# Patient Record
Sex: Female | Born: 1985 | Race: Black or African American | Hispanic: No | Marital: Married | Smoking: Never smoker
Health system: Southern US, Community
[De-identification: ages and names within clinical notes are randomized; demographics above are authoritative.]

## PROBLEM LIST (undated history)

## (undated) ENCOUNTER — Inpatient Hospital Stay (HOSPITAL_COMMUNITY): Payer: Self-pay

## (undated) ENCOUNTER — Emergency Department (HOSPITAL_COMMUNITY): Admission: EM | Payer: Self-pay | Source: Home / Self Care

## (undated) DIAGNOSIS — Z5189 Encounter for other specified aftercare: Secondary | ICD-10-CM

## (undated) DIAGNOSIS — O24419 Gestational diabetes mellitus in pregnancy, unspecified control: Secondary | ICD-10-CM

## (undated) DIAGNOSIS — Z3483 Encounter for supervision of other normal pregnancy, third trimester: Secondary | ICD-10-CM

## (undated) DIAGNOSIS — N898 Other specified noninflammatory disorders of vagina: Secondary | ICD-10-CM

## (undated) DIAGNOSIS — Z789 Other specified health status: Secondary | ICD-10-CM

## (undated) DIAGNOSIS — O09299 Supervision of pregnancy with other poor reproductive or obstetric history, unspecified trimester: Secondary | ICD-10-CM

## (undated) HISTORY — DX: Encounter for other specified aftercare: Z51.89

## (undated) HISTORY — DX: Gestational diabetes mellitus in pregnancy, unspecified control: O24.419

## (undated) HISTORY — DX: Other specified noninflammatory disorders of vagina: N89.8

## (undated) HISTORY — DX: Supervision of pregnancy with other poor reproductive or obstetric history, unspecified trimester: O09.299

---

## 2015-06-28 HISTORY — PX: DILATION AND CURETTAGE OF UTERUS: SHX78

## 2016-08-06 ENCOUNTER — Inpatient Hospital Stay (HOSPITAL_COMMUNITY)
Admission: AD | Admit: 2016-08-06 | Discharge: 2016-08-06 | Disposition: A | Discharge status: Home / Self Care | Payer: Medicaid Other | Source: Ambulatory Visit | Attending: Obstetrics & Gynecology | Admitting: Obstetrics & Gynecology

## 2016-08-06 ENCOUNTER — Inpatient Hospital Stay (HOSPITAL_COMMUNITY): Payer: Medicaid Other

## 2016-08-06 ENCOUNTER — Encounter (HOSPITAL_COMMUNITY): Payer: Self-pay | Admitting: *Deleted

## 2016-08-06 DIAGNOSIS — O200 Threatened abortion: Secondary | ICD-10-CM | POA: Diagnosis not present

## 2016-08-06 DIAGNOSIS — O209 Hemorrhage in early pregnancy, unspecified: Secondary | ICD-10-CM

## 2016-08-06 DIAGNOSIS — Z3A08 8 weeks gestation of pregnancy: Secondary | ICD-10-CM | POA: Diagnosis not present

## 2016-08-06 DIAGNOSIS — O4690 Antepartum hemorrhage, unspecified, unspecified trimester: Secondary | ICD-10-CM | POA: Diagnosis present

## 2016-08-06 HISTORY — DX: Other specified health status: Z78.9

## 2016-08-06 LAB — WET PREP, GENITAL
CLUE CELLS WET PREP: NONE SEEN
Sperm: NONE SEEN
TRICH WET PREP: NONE SEEN
YEAST WET PREP: NONE SEEN

## 2016-08-06 LAB — CBC
HCT: 37.3 % (ref 36.0–46.0)
HEMOGLOBIN: 12.6 g/dL (ref 12.0–15.0)
MCH: 29 pg (ref 26.0–34.0)
MCHC: 33.8 g/dL (ref 30.0–36.0)
MCV: 85.9 fL (ref 78.0–100.0)
Platelets: 274 10*3/uL (ref 150–400)
RBC: 4.34 MIL/uL (ref 3.87–5.11)
RDW: 13.6 % (ref 11.5–15.5)
WBC: 6.4 10*3/uL (ref 4.0–10.5)

## 2016-08-06 LAB — HCG, QUANTITATIVE, PREGNANCY: hCG, Beta Chain, Quant, S: 9241 m[IU]/mL — ABNORMAL HIGH (ref <5)

## 2016-08-06 LAB — URINALYSIS, ROUTINE W REFLEX MICROSCOPIC
Bilirubin Urine: NEGATIVE
GLUCOSE, UA: NEGATIVE mg/dL
Ketones, ur: 5 mg/dL — AB
Leukocytes, UA: NEGATIVE
Nitrite: NEGATIVE
PH: 7 (ref 5.0–8.0)
PROTEIN: NEGATIVE mg/dL
Specific Gravity, Urine: 1.026 (ref 1.005–1.030)

## 2016-08-06 LAB — POCT PREGNANCY, URINE: Preg Test, Ur: POSITIVE — AB

## 2016-08-06 NOTE — Discharge Instructions (Signed)
Threatened Miscarriage °A threatened miscarriage is when you have vaginal bleeding during your first 20 weeks of pregnancy but the pregnancy has not ended. Your doctor will do tests to make sure you are still pregnant. The cause of the bleeding may not be known. This condition does not mean your pregnancy will end. It does increase the risk of it ending (complete miscarriage). °Follow these instructions at home: °· Make sure you keep all your doctor visits for prenatal care. °· Get plenty of rest. °· Do not have sex or use tampons if you have vaginal bleeding. °· Do not douche. °· Do not smoke or use drugs. °· Do not drink alcohol. °· Avoid caffeine. °Contact a doctor if: °· You have light bleeding from your vagina. °· You have belly pain or cramping. °· You have a fever. °Get help right away if: °· You have heavy bleeding from your vagina. °· You have clots of blood coming from your vagina. °· You have bad pain or cramps in your low back or belly. °· You have fever, chills, and bad belly pain. °This information is not intended to replace advice given to you by your health care provider. Make sure you discuss any questions you have with your health care provider. °Document Released: 05/26/2008 Document Revised: 11/19/2015 Document Reviewed: 04/09/2013 °Elsevier Interactive Patient Education © 2017 Elsevier Inc. ° °

## 2016-08-06 NOTE — MAU Note (Signed)
Pt reports she has had bleeding 2 times today and some cramping thinks she is about 2 months pregnant

## 2016-08-06 NOTE — MAU Provider Note (Signed)
History     CSN: [id number]  Arrival date and time: 08/06/16 4132   First Provider Initiated Contact with Patient 08/06/16 2012      Chief Complaint  Patient presents with  . Vaginal Bleeding   HPI  Kseniya Dickie is a 31 y.o. G2P1001 at 109w2d who presents with vaginal bleeding. Reports small amount of dark red & brown blood on tissue x 2 episodes this evening. Has not had to wear a pad & states bleeding has decreased. Abdominal pain since 6 pm this evening. Pain comes & goes. Rates 6/10. Has not treated.  Denies n/v/d, dysuria, vaginal discharge, or recent intercourse. Last BM was 2 days ago.   Arabic interpreter used via language line.   OB History    Gravida Para Term Preterm AB Living   2 1 1     1    SAB TAB Ectopic Multiple Live Births           1      Past Medical History:  Diagnosis Date  . Medical history non-contributory     Past Surgical History:  Procedure Laterality Date  . DILATION AND CURETTAGE OF UTERUS  2017   retained placenta    No family history on file.  Social History  Substance Use Topics  . Smoking status: Never Smoker  . Smokeless tobacco: Never Used  . Alcohol use No    Allergies: No Known Allergies  No prescriptions prior to admission.    Review of Systems  Constitutional: Negative.   Gastrointestinal: Positive for abdominal pain. Negative for constipation, diarrhea, nausea and vomiting.  Genitourinary: Positive for vaginal bleeding. Negative for dysuria and vaginal discharge.   Physical Exam   Blood pressure 115/70, pulse 78, temperature 98 F (36.7 C), resp. rate 18, last menstrual period 06/10/2016.  Physical Exam  Nursing note and vitals reviewed. Constitutional: She is oriented to person, place, and time. She appears well-developed and well-nourished. No distress.  HENT:  Head: Normocephalic and atraumatic.  Eyes: Conjunctivae are normal. Right eye exhibits no discharge. Left eye exhibits no discharge. No scleral icterus.   Neck: Normal range of motion.  Cardiovascular: Normal rate, regular rhythm and normal heart sounds.   No murmur heard. Respiratory: Effort normal and breath sounds normal. No respiratory distress. She has no wheezes.  GI: Soft. Bowel sounds are normal. She exhibits no distension. There is no tenderness. There is no rebound and no guarding.  Genitourinary: Uterus normal. Cervix exhibits no motion tenderness and no friability. Right adnexum displays no mass and no tenderness. Left adnexum displays no mass and no tenderness. There is bleeding (small amount of brown blood; no active bleeding; cervix closed) in the vagina.  Neurological: She is alert and oriented to person, place, and time.  Skin: Skin is warm and dry. She is not diaphoretic.  Psychiatric: She has a normal mood and affect. Her behavior is normal. Judgment and thought content normal.    MAU Course  Procedures Results for orders placed or performed during the hospital encounter of 08/06/16 (from the past 24 hour(s))  Urinalysis, Routine w reflex microscopic     Status: Abnormal   Collection Time: 08/06/16  7:30 PM  Result Value Ref Range   Color, Urine YELLOW YELLOW   APPearance CLEAR CLEAR   Specific Gravity, Urine 1.026 1.005 - 1.030   pH 7.0 5.0 - 8.0   Glucose, UA NEGATIVE NEGATIVE mg/dL   Hgb urine dipstick MODERATE (A) NEGATIVE   Bilirubin Urine NEGATIVE NEGATIVE  Ketones, ur 5 (A) NEGATIVE mg/dL   Protein, ur NEGATIVE NEGATIVE mg/dL   Nitrite NEGATIVE NEGATIVE   Leukocytes, UA NEGATIVE NEGATIVE   RBC / HPF 0-5 0 - 5 RBC/hpf   WBC, UA 0-5 0 - 5 WBC/hpf   Bacteria, UA RARE (A) NONE SEEN   Squamous Epithelial / LPF 0-5 (A) NONE SEEN   Mucous PRESENT   Pregnancy, urine POC     Status: Abnormal   Collection Time: 08/06/16  7:37 PM  Result Value Ref Range   Preg Test, Ur POSITIVE (A) NEGATIVE  CBC     Status: None   Collection Time: 08/06/16  8:17 PM  Result Value Ref Range   WBC 6.4 4.0 - 10.5 K/uL   RBC 4.34  3.87 - 5.11 MIL/uL   Hemoglobin 12.6 12.0 - 15.0 g/dL   HCT 14.737.3 82.936.0 - 56.246.0 %   MCV 85.9 78.0 - 100.0 fL   MCH 29.0 26.0 - 34.0 pg   MCHC 33.8 30.0 - 36.0 g/dL   RDW 13.013.6 86.511.5 - 78.415.5 %   Platelets 274 150 - 400 K/uL  ABO/Rh     Status: None (Preliminary result)   Collection Time: 08/06/16  8:17 PM  Result Value Ref Range   ABO/RH(D) O POS   hCG, quantitative, pregnancy     Status: Abnormal   Collection Time: 08/06/16  8:17 PM  Result Value Ref Range   hCG, Beta Chain, Quant, S 9,241 (H) <5 mIU/mL  Wet prep, genital     Status: Abnormal   Collection Time: 08/06/16  8:34 PM  Result Value Ref Range   Yeast Wet Prep HPF POC NONE SEEN NONE SEEN   Trich, Wet Prep NONE SEEN NONE SEEN   Clue Cells Wet Prep HPF POC NONE SEEN NONE SEEN   WBC, Wet Prep HPF POC FEW (A) NONE SEEN   Sperm NONE SEEN    [place] Ob Comp Less 14 Wks  Result Date: 08/06/2016 CLINICAL DATA:  Bleeding with cramping EXAM: OBSTETRIC <14 WK US AND TRANSVAGINAL OB US TECHNIQUE: Both transabdominal and transvaginal ultrasound examinations were performed for complete evaluation of the gestation as well as the maternal uterus, adnexal regions, and pelvic cul-de-sac. Transvaginal technique was performed to assess early pregnancy. COMPARISON:  None. FINDINGS: Intrauterine gestational sac: Single intrauterine gestational sac irregular in shape. Yolk sac: Normal sac not definitively visualized. Circular echogenicity within the gestational sac could represent an abnormally enlarged yolk sac. Embryo:  Possible fetal pole visualized. Cardiac Activity: Not visualized CRL:  2.6  mm   5 w   5 d                  US EDC: 04/03/2017 Subchorionic hemorrhage:  None visualized. Maternal uterus/adnexae: Ovaries are within normal limits. Left ovary measures 3.5 x 2.2 x 2.3 cm. Right ovary measures 3 x 2.3 x 2 cm. No free fluid. IMPRESSION: 1. Irregularly shaped intrauterine gestational sac. Possible tiny fetal pole visualized but no demonstrable cardiac  activity. Findings are suspicious but not yet definitive for failed pregnancy. Recommend follow-up US in 10-14 days for definitive diagnosis. This recommendation follows SRU consensus guidelines: Diagnostic Criteria for Nonviable Pregnancy Early in the First Trimester. Malva Limes Engl J Med 2013; 696:2952-84; 369:1443-51. Electronically Signed   By: Jasmine PangKim  Fujinaga M.D.   On: 08/06/2016 22:11   [place] Ob Transvaginal  Result Date: 08/06/2016 CLINICAL DATA:  Bleeding with cramping EXAM: OBSTETRIC <14 WK US AND TRANSVAGINAL OB US TECHNIQUE: Both transabdominal and transvaginal ultrasound  examinations were performed for complete evaluation of the gestation as well as the maternal uterus, adnexal regions, and pelvic cul-de-sac. Transvaginal technique was performed to assess early pregnancy. COMPARISON:  None. FINDINGS: Intrauterine gestational sac: Single intrauterine gestational sac irregular in shape. Yolk sac: Normal sac not definitively visualized. Circular echogenicity within the gestational sac could represent an abnormally enlarged yolk sac. Embryo:  Possible fetal pole visualized. Cardiac Activity: Not visualized CRL:  2.6  mm   5 w   5 d                  [place] EDC: 04/03/2017 Subchorionic hemorrhage:  None visualized. Maternal uterus/adnexae: Ovaries are within normal limits. Left ovary measures 3.5 x 2.2 x 2.3 cm. Right ovary measures 3 x 2.3 x 2 cm. No free fluid. IMPRESSION: 1. Irregularly shaped intrauterine gestational sac. Possible tiny fetal pole visualized but no demonstrable cardiac activity. Findings are suspicious but not yet definitive for failed pregnancy. Recommend follow-up US in 10-14 days for definitive diagnosis. This recommendation follows SRU consensus guidelines: Diagnostic Criteria for Nonviable Pregnancy Early in the First Trimester. Malva Limes Med 2013; 409:8119-14. Electronically Signed   By: Jasmine Pang M.D.   On: 08/06/2016 22:11    MDM +UPT UA, wet prep, GC/chlamydia, CBC, ABO/Rh, quant hCG, HIV, and [place]  today to rule out ectopic pregnancy O positive Ultrasound shows irregularly shaped IUGS with "tiny" fetal pole & no cardiac activity -- suspicious for failed pregnancy Discussed results with patient via interpreter line -- will have pt f/u for outpatient ultrasound as recommended by radiologist Assessment and Plan  A: 1. Threatened miscarriage   2. Vaginal bleeding in pregnancy, first trimester    P: Discharge home Outpatient f/u ultrasound in 2 weeks  Msg to [facility] for f/u appt Pelvic rest Discussed reasons to return to MAU GC/CT pending  Judeth Horn 08/06/2016, 8:12 PM

## 2016-08-07 ENCOUNTER — Encounter (HOSPITAL_COMMUNITY): Payer: Self-pay | Admitting: Student

## 2016-08-07 LAB — ABO/RH: ABO/RH(D): O POS

## 2016-08-07 LAB — HIV ANTIBODY (ROUTINE TESTING W REFLEX): HIV SCREEN 4TH GENERATION: NONREACTIVE

## 2016-08-08 LAB — GC/CHLAMYDIA PROBE AMP (~~LOC~~) NOT AT ARMC
CHLAMYDIA, DNA PROBE: NEGATIVE
NEISSERIA GONORRHEA: NEGATIVE

## 2016-08-10 ENCOUNTER — Inpatient Hospital Stay (HOSPITAL_COMMUNITY): Payer: Medicaid Other

## 2016-08-10 ENCOUNTER — Telehealth: Payer: Self-pay | Admitting: Student

## 2016-08-10 ENCOUNTER — Encounter (HOSPITAL_COMMUNITY): Payer: Self-pay

## 2016-08-10 ENCOUNTER — Inpatient Hospital Stay (HOSPITAL_COMMUNITY)
Admission: AD | Admit: 2016-08-10 | Discharge: 2016-08-10 | Disposition: A | Discharge status: Home / Self Care | Payer: Medicaid Other | Source: Ambulatory Visit | Attending: Obstetrics & Gynecology | Admitting: Obstetrics & Gynecology

## 2016-08-10 DIAGNOSIS — O039 Complete or unspecified spontaneous abortion without complication: Secondary | ICD-10-CM | POA: Diagnosis not present

## 2016-08-10 DIAGNOSIS — N939 Abnormal uterine and vaginal bleeding, unspecified: Secondary | ICD-10-CM

## 2016-08-10 LAB — URINALYSIS, ROUTINE W REFLEX MICROSCOPIC
BILIRUBIN URINE: NEGATIVE
Glucose, UA: NEGATIVE mg/dL
Ketones, ur: NEGATIVE mg/dL
Leukocytes, UA: NEGATIVE
Nitrite: NEGATIVE
PH: 6 (ref 5.0–8.0)
Protein, ur: NEGATIVE mg/dL
SPECIFIC GRAVITY, URINE: 1.025 (ref 1.005–1.030)

## 2016-08-10 LAB — URINALYSIS, MICROSCOPIC (REFLEX)
Bacteria, UA: NONE SEEN
SQUAMOUS EPITHELIAL / LPF: NONE SEEN
WBC, UA: NONE SEEN WBC/hpf (ref 0–5)

## 2016-08-10 LAB — HCG, QUANTITATIVE, PREGNANCY: hCG, Beta Chain, Quant, S: 6073 m[IU]/mL — ABNORMAL HIGH (ref <5)

## 2016-08-10 NOTE — MAU Provider Note (Signed)
Patient Abigail Knight is a 31 year old G2P1001 with complaints of increased vaginal bleeding. Patient was recently seen in MAU on 08/06/16 with complaints of bleeding and pain. She was diagnosed with threatened abortion and was d/c with recommendation to follow up US in 10 days. [place] report on 2/10 showed Intrauterine irregular gestational sac, a normal yolk sac was not visualized, possible fetal pole visualized with no cardiac activity. Dating is 5 weeks and 5 days.   Language Line interpreter used for visit.  History     CSN: [id number]  Arrival date and time: 08/10/16 0221   First Provider Initiated Contact with Patient 08/10/16 0255      Chief Complaint  Patient presents with  . Vaginal Bleeding   Vaginal Bleeding  The patient's primary symptoms include vaginal bleeding. The patient's pertinent negatives include no genital itching, genital lesions, genital odor, genital rash, missed menses, pelvic pain or vaginal discharge. Primary symptoms comment: patient states that she has filled one maternity pad in 2 hours. This is a new problem. The current episode started today. The problem occurs intermittently. The problem has been gradually worsening. The pain is mild. She is pregnant. Associated symptoms include abdominal pain. Pertinent negatives include no anorexia, back pain, chills, constipation, diarrhea, discolored urine, dysuria, fever, flank pain, frequency, headaches, hematuria, joint pain, joint swelling, nausea, painful intercourse, rash, sore throat, urgency or vomiting. The vaginal discharge was bloody. The vaginal bleeding is typical of menses. She has been passing clots. She has not been passing tissue. Nothing aggravates the symptoms. She has tried nothing for the symptoms.  Abdominal Pain  This is a new problem. The current episode started today. The onset quality is sudden. The problem occurs intermittently. The pain is located in the suprapubic region. The pain is at a severity of  6/10. The pain is moderate. The quality of the pain is cramping. The abdominal pain radiates to the back. Pertinent negatives include no anorexia, constipation, diarrhea, dysuria, fever, frequency, headaches, hematuria, nausea or vomiting. Nothing aggravates the pain. The pain is relieved by nothing. She has tried nothing for the symptoms.    OB History    Gravida Para Term Preterm AB Living   2 1 1     1    SAB TAB Ectopic Multiple Live Births           1      Past Medical History:  Diagnosis Date  . Medical history non-contributory     Past Surgical History:  Procedure Laterality Date  . DILATION AND CURETTAGE OF UTERUS  2017   retained placenta    History reviewed. No pertinent family history.  Social History  Substance Use Topics  . Smoking status: Never Smoker  . Smokeless tobacco: Never Used  . Alcohol use No    Allergies: No Known Allergies  No prescriptions prior to admission.    Review of Systems  Constitutional: Negative for chills and fever.  HENT: Negative for sore throat.   Eyes: Negative.   Respiratory: Negative.   Gastrointestinal: Positive for abdominal pain. Negative for anorexia, constipation, diarrhea, nausea and vomiting.  Endocrine: Negative.   Genitourinary: Positive for vaginal bleeding. Negative for dysuria, flank pain, frequency, hematuria, missed menses, pelvic pain, urgency and vaginal discharge.  Musculoskeletal: Negative for back pain and joint pain.  Skin: Negative for rash.  Allergic/Immunologic: Negative.   Neurological: Negative for headaches.  Hematological: Negative.   Psychiatric/Behavioral: Negative.    Physical Exam   Blood pressure 122/78, pulse 84,  temperature 98.4 F (36.9 C), temperature source Oral, resp. rate 18, last menstrual period 06/10/2016, SpO2 100 %.  Physical Exam  Constitutional: She is oriented to person, place, and time. She appears well-developed.  HENT:  Head: Normocephalic.  Neck: Normal range of  motion.  Respiratory: Effort normal.  GI: Soft. She exhibits no distension. There is no tenderness. There is no rebound and no guarding.  Genitourinary: Vaginal discharge found.  Genitourinary Comments: NEFG; no lesions on vaginal walls. Small amount of bright red blood in the vaginal vault with small pieces of tissues; No CMT and no suprapubic tenderness. Cervix is closed.   Musculoskeletal: Normal range of motion.  Neurological: She is alert and oriented to person, place, and time.  Skin: Skin is warm and dry.  Psychiatric: She has a normal mood and affect.    MAU Course  Procedures  MDM -beta hcg decreased from 9241 to 6073 (30%) in four days -tissue sent to pathology -transvaginal us showed no gestational sac, yolk sac or embryo. No free fluid in the pelvis.   -patient says that her bleeding is a little bit less now and that the pain is now a 2/10, down from a 5/10.  Assessment and Plan   1. Miscarriage   2. Vaginal bleeding    2. Reviewed with patient the results of US, using interpreter. Patient stable for D/C. Informed patient to return to MAU if bleeding suddenly increases or she has an increase in pain or fever.  3. Note sent to clinic to schedule SAB follow up in two weeks.   Charlesetta GaribaldiKathryn Lorraine Kooistra CNM 08/10/2016, 3:18 AM

## 2016-08-10 NOTE — Telephone Encounter (Signed)
Left message for husband telling patient to return to MAU on 08/11/2016 at 11 am for fup beta hcg.

## 2016-08-10 NOTE — MAU Note (Signed)
Pt presents complaining of vaginal period that is more than a period that started about 12 hours ago. Reports cramping in her back in lower abdomen. Denies other abnormal discharge.

## 2016-08-10 NOTE — Discharge Instructions (Signed)
Ok to take Ibuprofen for pain Return to MAU if bleeding becomes heavy or she starts to have pain increase  Miscarriage A miscarriage is the loss of an unborn baby (fetus) before the 20th week of pregnancy. The cause is often unknown. Follow these instructions at home:  You may need to stay in bed (bed rest), or you may be able to do light activity. Go about activity as told by your doctor.  Have help at home.  Write down how many pads you use each day. Write down how soaked they are.  Do not use tampons. Do not wash out your vagina (douche) or have sex (intercourse) until your doctor approves.  Only take medicine as told by your doctor.  Do not take aspirin.  Keep all doctor visits as told.  If you or your partner have problems with grieving, talk to your doctor. You can also try counseling. Give yourself time to grieve before trying to get pregnant again. Get help right away if:  You have bad cramps or pain in your back or belly (abdomen).  You have a fever.  You pass large clumps of blood (clots) from your vagina that are walnut-sized or larger. Save the clumps for your doctor to see.  You pass large amounts of tissue from your vagina. Save the tissue for your doctor to see.  You have more bleeding.  You have thick, bad-smelling fluid (discharge) coming from the vagina.  You get lightheaded, weak, or you pass out (faint).  You have chills. This information is not intended to replace advice given to you by your health care provider. Make sure you discuss any questions you have with your health care provider. Document Released: 09/05/2011 Document Revised: 11/19/2015 Document Reviewed: 07/14/2011 Elsevier Interactive Patient Education  2017 [employer]

## 2016-08-11 ENCOUNTER — Ambulatory Visit: Payer: Medicaid Other | Admitting: General Practice

## 2016-08-11 ENCOUNTER — Inpatient Hospital Stay (HOSPITAL_COMMUNITY)
Admission: AD | Admit: 2016-08-11 | Discharge: 2016-08-11 | Discharge status: Absent without leave | Payer: Medicaid Other | Source: Ambulatory Visit | Attending: Obstetrics & Gynecology | Admitting: Obstetrics & Gynecology

## 2016-08-11 DIAGNOSIS — O039 Complete or unspecified spontaneous abortion without complication: Secondary | ICD-10-CM

## 2016-08-11 LAB — HCG, QUANTITATIVE, PREGNANCY: HCG, BETA CHAIN, QUANT, S: 3726 m[IU]/mL — AB (ref <5)

## 2016-08-11 NOTE — Progress Notes (Signed)
Patient feeling well; described bleeding as less and has no pain. Patient can be called results and she should follow up in the clinic on 08-29-2016 at 3:20 pm.

## 2016-08-11 NOTE — Progress Notes (Signed)
Patient here for stat bhcg today. Used stratus video [id number] for communication. Patient reports no pain and less bleeding than previous MAU visit. Per Luna KitchensKathryn Kooistra, patient can be contacted with results. Patient provided number [phone]. Per Judeth HornErin Lawrence, patient's levels have dropped appropriately and she may follow up at her scheduled visit on 3/5. Called patient with pacific interpreter [phone] and informed patient of results and appt on 3/5. Patient verbalized understanding and had no questions

## 2016-08-17 ENCOUNTER — Telehealth: Payer: Self-pay | Admitting: *Deleted

## 2016-08-17 NOTE — Telephone Encounter (Signed)
Per Lacey JensenKathryn Koostra, CNM  needs to come in Wednesday or Thursday  for serial bhcg to rule out ectopic pregnancy because us did not show pregnancy.

## 2016-08-17 NOTE — Telephone Encounter (Signed)
Called Kiannah with [employer] Interpreter [phone] at home number and was unable to leave a message- heard message not taking calls right now.  Called mobile number and left message we are calling to make lab appt for this week- please call our office.

## 2016-08-22 ENCOUNTER — Encounter: Payer: Self-pay | Admitting: *Deleted

## 2016-08-22 NOTE — Telephone Encounter (Addendum)
Called Abigail Knight home number and unable to leave a message- heard a message not taking calls right now. Called mobile number with [employer] interpreter [phone] and a female answered and said he is her husband and he is at work - she is at  Home- informed him we could not reach her and would he give her a message. He stated he would give her a message. I asked him to tell her we were calling to schedule a lab appt- please call our office. He states she is going to her doctors office- [facility] and will not be coming to our office. I will send a letter since I did not talk with [name].

## 2016-08-29 ENCOUNTER — Ambulatory Visit (HOSPITAL_COMMUNITY): Payer: Medicaid Other

## 2016-08-29 ENCOUNTER — Encounter: Payer: Medicaid Other | Admitting: Medical

## 2016-08-29 ENCOUNTER — Encounter: Payer: Self-pay | Admitting: *Deleted

## 2016-08-29 NOTE — Progress Notes (Unsigned)
Abigail Knight missed another scheduled appointment - per discussion do not need to call her, may reschedule if she calls.

## 2016-08-30 ENCOUNTER — Other Ambulatory Visit: Payer: Medicaid Other

## 2016-08-30 ENCOUNTER — Ambulatory Visit (HOSPITAL_COMMUNITY)
Admission: RE | Admit: 2016-08-30 | Discharge: 2016-08-30 | Disposition: A | Discharge status: Home / Self Care | Payer: Medicaid Other | Source: Ambulatory Visit | Attending: Student | Admitting: Student

## 2016-08-30 ENCOUNTER — Encounter (HOSPITAL_COMMUNITY): Payer: Self-pay

## 2016-08-30 DIAGNOSIS — O3680X0 Pregnancy with inconclusive fetal viability, not applicable or unspecified: Secondary | ICD-10-CM

## 2016-08-30 DIAGNOSIS — O209 Hemorrhage in early pregnancy, unspecified: Secondary | ICD-10-CM

## 2016-08-30 DIAGNOSIS — O200 Threatened abortion: Secondary | ICD-10-CM

## 2016-08-31 LAB — BETA HCG QUANT (REF LAB): HCG QUANT: 30 m[IU]/mL

## 2017-04-25 LAB — OB RESULTS CONSOLE RPR: RPR: NONREACTIVE

## 2017-04-25 LAB — OB RESULTS CONSOLE ABO/RH: RH TYPE: POSITIVE

## 2017-04-25 LAB — OB RESULTS CONSOLE GC/CHLAMYDIA
Chlamydia: NEGATIVE
Gonorrhea: NEGATIVE

## 2017-04-25 LAB — OB RESULTS CONSOLE ANTIBODY SCREEN: ANTIBODY SCREEN: NEGATIVE

## 2017-04-25 LAB — OB RESULTS CONSOLE HEPATITIS B SURFACE ANTIGEN: HEP B S AG: NEGATIVE

## 2017-04-25 LAB — OB RESULTS CONSOLE HIV ANTIBODY (ROUTINE TESTING): HIV: NONREACTIVE

## 2017-04-25 LAB — OB RESULTS CONSOLE RUBELLA ANTIBODY, IGM: Rubella: IMMUNE

## 2017-06-09 ENCOUNTER — Encounter (HOSPITAL_COMMUNITY): Payer: Self-pay

## 2017-06-27 NOTE — L&D Delivery Note (Signed)
Delivery Note At 7:53 AM a viable and healthy female was delivered via Vaginal, Spontaneous (Presentation: vtx  ).  APGAR: 9, 9; weight 8 lb 4.5 oz (3755 g).   Placenta status: delivered, intact .  Cord:  3 Vwith the following complications:none    Anesthesia:  none Episiotomy: None Lacerations: 2nd degree Suture Repair: 3.0 vicryl rapide Est. Blood Loss (mL): 450cc  Mom to postpartum.  Baby to Couplet care / Skin to Skin.  Sean Malinowski Bovard-Stuckert 11/22/2017, 4:08 PM

## 2017-08-21 DIAGNOSIS — Z8759 Personal history of other complications of pregnancy, childbirth and the puerperium: Secondary | ICD-10-CM | POA: Insufficient documentation

## 2017-09-20 ENCOUNTER — Encounter: Payer: Medicaid Other | Attending: Obstetrics and Gynecology | Admitting: *Deleted

## 2017-09-20 DIAGNOSIS — Z3A00 Weeks of gestation of pregnancy not specified: Secondary | ICD-10-CM | POA: Diagnosis not present

## 2017-09-20 DIAGNOSIS — Z713 Dietary counseling and surveillance: Secondary | ICD-10-CM | POA: Diagnosis not present

## 2017-09-20 DIAGNOSIS — O24410 Gestational diabetes mellitus in pregnancy, diet controlled: Secondary | ICD-10-CM | POA: Insufficient documentation

## 2017-09-20 DIAGNOSIS — R7309 Other abnormal glucose: Secondary | ICD-10-CM

## 2017-09-20 NOTE — Progress Notes (Signed)
  Patient was seen on 09/20/2017 for Gestational Diabetes self-management. EDD 11/04/2017. Patient speaks Arabic, here with Interpretor. Patient states history of GDM with last pregnancy about 3 years ago, when living in [place]. Diet history obtained. Patient eats good variety of all food groups and beverages include unsweetened beverages.  The following learning objectives were met by the patient :   States the definition of Gestational Diabetes  States why dietary management is important in controlling blood glucose  Describes the effects of carbohydrates on blood glucose levels  Demonstrates ability to create a balanced meal plan  Demonstrates carbohydrate counting   States when to check blood glucose levels  Demonstrates proper blood glucose monitoring techniques  States the effect of stress and exercise on blood glucose levels  States the importance of limiting caffeine and abstaining from alcohol and smoking  Plan:  Aim for 3 Carb Choices per meal (45 grams) +/- 1 either way  Aim for 1-2 Carbs per snack Begin reading food labels for Total Carbohydrate of foods Consider  increasing your activity level by walking or other activity daily as tolerated Begin checking BG before breakfast and 2 hours after first bite of breakfast, lunch and dinner as directed by MD  Bring Log Book/Sheet to every medical appointment   Take medication if directed by MD  I called her OB office to notify them that Medicaid covers the Accu-chek Guide meter with Fast Clix Drums. I provided them with her preferred pharmacy of [facility] on [address] so they can call Rx in.for pick up.  Patient instructed to test pre breakfast and 2 hours each meal as directed by MD BG Log Sheet provided for patient to record BG results on daily and to bring to every appointment.  Patient instructed to monitor glucose levels: FBS: 60 - 95 mg/dl 2 hour: <120 mg/dl   Patient received the following handouts:in  Arabic  Nutrition Diabetes and Pregnancy  Carbohydrate Counting List  Patient will be seen for follow-up as needed.

## 2017-10-11 LAB — OB RESULTS CONSOLE GBS: STREP GROUP B AG: NEGATIVE

## 2017-11-08 ENCOUNTER — Telehealth (HOSPITAL_COMMUNITY): Payer: Self-pay | Admitting: *Deleted

## 2017-11-08 ENCOUNTER — Encounter (HOSPITAL_COMMUNITY): Payer: Self-pay | Admitting: *Deleted

## 2017-11-08 NOTE — Telephone Encounter (Signed)
Preadmission screen Interpreter number [phone]

## 2017-11-11 ENCOUNTER — Inpatient Hospital Stay (EMERGENCY_DEPARTMENT_HOSPITAL)
Admission: AD | Admit: 2017-11-11 | Discharge: 2017-11-11 | Disposition: A | Discharge status: Home / Self Care | Payer: Medicaid Other | Source: Ambulatory Visit | Attending: Obstetrics and Gynecology | Admitting: Obstetrics and Gynecology

## 2017-11-11 ENCOUNTER — Encounter (HOSPITAL_COMMUNITY): Payer: Self-pay | Admitting: Obstetrics and Gynecology

## 2017-11-11 ENCOUNTER — Inpatient Hospital Stay (HOSPITAL_COMMUNITY)
Admission: AD | Admit: 2017-11-11 | Discharge: 2017-11-12 | DRG: 807 | Disposition: A | Discharge status: Home / Self Care | Payer: Medicaid Other | Source: Ambulatory Visit | Attending: Obstetrics and Gynecology | Admitting: Obstetrics and Gynecology

## 2017-11-11 DIAGNOSIS — Z3A39 39 weeks gestation of pregnancy: Secondary | ICD-10-CM | POA: Diagnosis not present

## 2017-11-11 DIAGNOSIS — O26893 Other specified pregnancy related conditions, third trimester: Secondary | ICD-10-CM

## 2017-11-11 DIAGNOSIS — Z0371 Encounter for suspected problem with amniotic cavity and membrane ruled out: Secondary | ICD-10-CM

## 2017-11-11 DIAGNOSIS — O471 False labor at or after 37 completed weeks of gestation: Secondary | ICD-10-CM

## 2017-11-11 DIAGNOSIS — O24420 Gestational diabetes mellitus in childbirth, diet controlled: Principal | ICD-10-CM | POA: Diagnosis present

## 2017-11-11 DIAGNOSIS — Z3483 Encounter for supervision of other normal pregnancy, third trimester: Secondary | ICD-10-CM | POA: Diagnosis present

## 2017-11-11 DIAGNOSIS — N898 Other specified noninflammatory disorders of vagina: Secondary | ICD-10-CM | POA: Diagnosis not present

## 2017-11-11 DIAGNOSIS — Z3490 Encounter for supervision of normal pregnancy, unspecified, unspecified trimester: Secondary | ICD-10-CM

## 2017-11-11 HISTORY — DX: Encounter for supervision of other normal pregnancy, third trimester: Z34.83

## 2017-11-11 LAB — CBC
HEMATOCRIT: 34.6 % — AB (ref 36.0–46.0)
Hemoglobin: 11.2 g/dL — ABNORMAL LOW (ref 12.0–15.0)
MCH: 28.7 pg (ref 26.0–34.0)
MCHC: 32.4 g/dL (ref 30.0–36.0)
MCV: 88.7 fL (ref 78.0–100.0)
PLATELETS: 165 10*3/uL (ref 150–400)
RBC: 3.9 MIL/uL (ref 3.87–5.11)
RDW: 15.4 % (ref 11.5–15.5)
WBC: 10.3 10*3/uL (ref 4.0–10.5)

## 2017-11-11 LAB — RPR: RPR Ser Ql: NONREACTIVE

## 2017-11-11 LAB — TYPE AND SCREEN
ABO/RH(D): O POS
ANTIBODY SCREEN: NEGATIVE

## 2017-11-11 LAB — POCT FERN TEST
POCT Fern Test: NEGATIVE
POCT Fern Test: NEGATIVE

## 2017-11-11 LAB — GLUCOSE, CAPILLARY: Glucose-Capillary: 120 mg/dL — ABNORMAL HIGH (ref 65–99)

## 2017-11-11 MED ORDER — IBUPROFEN 600 MG PO TABS
600.0000 mg | ORAL_TABLET | Freq: Four times a day (QID) | ORAL | Status: DC
  Administered 2017-11-11 – 2017-11-12 (×5): 600 mg via ORAL
  Filled 2017-11-11 (×5): qty 1

## 2017-11-11 MED ORDER — PHENYLEPHRINE 40 MCG/ML (10ML) SYRINGE FOR IV PUSH (FOR BLOOD PRESSURE SUPPORT)
80.0000 ug | PREFILLED_SYRINGE | INTRAVENOUS | Status: DC | PRN
  Filled 2017-11-11: qty 5

## 2017-11-11 MED ORDER — FENTANYL CITRATE (PF) 100 MCG/2ML IJ SOLN
100.0000 ug | Freq: Once | INTRAMUSCULAR | Status: AC
  Administered 2017-11-11: 100 ug via INTRAVENOUS

## 2017-11-11 MED ORDER — WITCH HAZEL-GLYCERIN EX PADS: 1.0000 "application " | MEDICATED_PAD | CUTANEOUS | Status: DC | PRN

## 2017-11-11 MED ORDER — EPHEDRINE 5 MG/ML INJ
10.0000 mg | INTRAVENOUS | Status: DC | PRN
  Filled 2017-11-11: qty 2

## 2017-11-11 MED ORDER — PHENYLEPHRINE 40 MCG/ML (10ML) SYRINGE FOR IV PUSH (FOR BLOOD PRESSURE SUPPORT)
80.0000 ug | PREFILLED_SYRINGE | INTRAVENOUS | Status: DC | PRN
  Filled 2017-11-11: qty 5
  Filled 2017-11-11: qty 10

## 2017-11-11 MED ORDER — OXYTOCIN 10 UNIT/ML IJ SOLN
INTRAMUSCULAR | Status: AC
  Filled 2017-11-11: qty 2

## 2017-11-11 MED ORDER — FENTANYL 2.5 MCG/ML BUPIVACAINE 1/10 % EPIDURAL INFUSION (WH - ANES)
14.0000 mL/h | INTRAMUSCULAR | Status: DC | PRN
  Filled 2017-11-11: qty 100

## 2017-11-11 MED ORDER — ACETAMINOPHEN 325 MG PO TABS: 650.0000 mg | ORAL_TABLET | ORAL | Status: DC | PRN

## 2017-11-11 MED ORDER — SIMETHICONE 80 MG PO CHEW: 80.0000 mg | CHEWABLE_TABLET | ORAL | Status: DC | PRN

## 2017-11-11 MED ORDER — SENNOSIDES-DOCUSATE SODIUM 8.6-50 MG PO TABS
2.0000 | ORAL_TABLET | ORAL | Status: DC
  Administered 2017-11-12: 2 via ORAL
  Filled 2017-11-11: qty 2

## 2017-11-11 MED ORDER — OXYCODONE-ACETAMINOPHEN 5-325 MG PO TABS: 2.0000 | ORAL_TABLET | ORAL | Status: DC | PRN

## 2017-11-11 MED ORDER — OXYCODONE-ACETAMINOPHEN 5-325 MG PO TABS: 1.0000 | ORAL_TABLET | ORAL | Status: DC | PRN

## 2017-11-11 MED ORDER — FENTANYL CITRATE (PF) 100 MCG/2ML IJ SOLN
INTRAMUSCULAR | Status: AC
  Filled 2017-11-11: qty 2

## 2017-11-11 MED ORDER — ZOLPIDEM TARTRATE 5 MG PO TABS: 5.0000 mg | ORAL_TABLET | Freq: Every evening | ORAL | Status: DC | PRN

## 2017-11-11 MED ORDER — ONDANSETRON HCL 4 MG/2ML IJ SOLN: 4.0000 mg | INTRAMUSCULAR | Status: DC | PRN

## 2017-11-11 MED ORDER — ONDANSETRON HCL 4 MG PO TABS: 4.0000 mg | ORAL_TABLET | ORAL | Status: DC | PRN

## 2017-11-11 MED ORDER — TETANUS-DIPHTH-ACELL PERTUSSIS 5-2.5-18.5 LF-MCG/0.5 IM SUSP
0.5000 mL | Freq: Once | INTRAMUSCULAR | Status: AC
  Administered 2017-11-12: 0.5 mL via INTRAMUSCULAR
  Filled 2017-11-11: qty 0.5

## 2017-11-11 MED ORDER — LACTATED RINGERS IV SOLN: 500.0000 mL | Freq: Once | INTRAVENOUS | Status: DC

## 2017-11-11 MED ORDER — LIDOCAINE HCL (PF) 1 % IJ SOLN
30.0000 mL | INTRAMUSCULAR | Status: DC | PRN
Start: 2017-11-11 — End: 2017-11-11
  Filled 2017-11-11: qty 30

## 2017-11-11 MED ORDER — OXYCODONE HCL 5 MG PO TABS
5.0000 mg | ORAL_TABLET | ORAL | Status: DC | PRN
  Administered 2017-11-11 – 2017-11-12 (×3): 5 mg via ORAL
  Filled 2017-11-11 (×3): qty 1

## 2017-11-11 MED ORDER — LACTATED RINGERS IV SOLN: INTRAVENOUS | Status: DC

## 2017-11-11 MED ORDER — OXYCODONE HCL 5 MG PO TABS: 10.0000 mg | ORAL_TABLET | ORAL | Status: DC | PRN

## 2017-11-11 MED ORDER — OXYTOCIN 40 UNITS IN LACTATED RINGERS INFUSION - SIMPLE MED: 2.5000 [IU]/h | INTRAVENOUS | Status: DC

## 2017-11-11 MED ORDER — DIBUCAINE 1 % RE OINT: 1.0000 "application " | TOPICAL_OINTMENT | RECTAL | Status: DC | PRN

## 2017-11-11 MED ORDER — SOD CITRATE-CITRIC ACID 500-334 MG/5ML PO SOLN: 30.0000 mL | ORAL | Status: DC | PRN

## 2017-11-11 MED ORDER — OXYTOCIN 40 UNITS IN LACTATED RINGERS INFUSION - SIMPLE MED
INTRAVENOUS | Status: AC
  Filled 2017-11-11: qty 1000

## 2017-11-11 MED ORDER — LIDOCAINE HCL (PF) 1 % IJ SOLN
INTRAMUSCULAR | Status: AC
  Administered 2017-11-11: 30 mL
  Filled 2017-11-11: qty 30

## 2017-11-11 MED ORDER — MISOPROSTOL 200 MCG PO TABS
ORAL_TABLET | ORAL | Status: AC
  Filled 2017-11-11: qty 5

## 2017-11-11 MED ORDER — ACETAMINOPHEN 325 MG PO TABS
650.0000 mg | ORAL_TABLET | ORAL | Status: DC | PRN
  Administered 2017-11-11: 650 mg via ORAL
  Filled 2017-11-11: qty 2

## 2017-11-11 MED ORDER — BENZOCAINE-MENTHOL 20-0.5 % EX AERO
1.0000 "application " | INHALATION_SPRAY | CUTANEOUS | Status: DC | PRN
  Administered 2017-11-11: 1 via TOPICAL
  Filled 2017-11-11: qty 56

## 2017-11-11 MED ORDER — LACTATED RINGERS IV SOLN: 500.0000 mL | INTRAVENOUS | Status: DC | PRN

## 2017-11-11 MED ORDER — PRENATAL MULTIVITAMIN CH
1.0000 | ORAL_TABLET | Freq: Every day | ORAL | Status: DC
  Administered 2017-11-11 – 2017-11-12 (×2): 1 via ORAL
  Filled 2017-11-11 (×2): qty 1

## 2017-11-11 MED ORDER — OXYTOCIN BOLUS FROM INFUSION
500.0000 mL | Freq: Once | INTRAVENOUS | Status: AC
  Administered 2017-11-11: 500 mL via INTRAVENOUS

## 2017-11-11 MED ORDER — DIPHENHYDRAMINE HCL 50 MG/ML IJ SOLN: 12.5000 mg | INTRAMUSCULAR | Status: DC | PRN

## 2017-11-11 MED ORDER — DIPHENHYDRAMINE HCL 25 MG PO CAPS: 25.0000 mg | ORAL_CAPSULE | Freq: Four times a day (QID) | ORAL | Status: DC | PRN

## 2017-11-11 MED ORDER — ONDANSETRON HCL 4 MG/2ML IJ SOLN: 4.0000 mg | Freq: Four times a day (QID) | INTRAMUSCULAR | Status: DC | PRN

## 2017-11-11 MED ORDER — COCONUT OIL OIL: 1.0000 "application " | TOPICAL_OIL | Status: DC | PRN

## 2017-11-11 MED ORDER — MISOPROSTOL 200 MCG PO TABS
800.0000 ug | ORAL_TABLET | Freq: Once | ORAL | Status: AC
  Administered 2017-11-11: 800 ug via RECTAL

## 2017-11-11 NOTE — Discharge Instructions (Signed)

## 2017-11-11 NOTE — H&P (Signed)
Abigail Knight is a 32 y.o. female G3P1011 at 39+ who presented earlier in the night for labor check and had no cervical change.  Represents very uncomfortable in active labor.  Pt dated by LMP consistent with early [place].  Relatively uncomplicated PNC.  There is a language barrier.  Pt has GDM - diet controlled with questionable history in G1 pregnancy.  Pt also with PPHMG and RPOC at 10 days PP needed D&C and 2 units prbcs.  FSBS = 120, recheck 1 hr  OB History    Gravida  3   Para  1   Term  1   Preterm      AB  1   Living  1     SAB  1   TAB      Ectopic      Multiple      Live Births  1         G1 9# female, born in [place], ?GDM, [facility] 06/26/[birth year] G2 SAB G3 present, GDM  No abn pap No STD  Past Medical History:  Diagnosis Date  . Blood transfusion without reported diagnosis    PPH  . Gestational diabetes   . History of postpartum hemorrhage, currently pregnant   . Medical history non-contributory   . Normal pregnancy in multigravida in third trimester 11/11/2017  . Vaginal cyst    Past Surgical History:  Procedure Laterality Date  . DILATION AND CURETTAGE OF UTERUS  2017   retained placenta   Family History: family history is not on file. Social History:  reports that she has never smoked. She has never used smokeless tobacco. She reports that she does not drink alcohol or use drugs.  Married Meds Folic Acid All: NKDA     Maternal Diabetes: Yes:  Diabetes Type:  Diet controlled Genetic Screening: Normal Maternal Ultrasounds/Referrals: Normal Fetal Ultrasounds or other Referrals:  None Maternal Substance Abuse:  No Significant Maternal Medications:  None Significant Maternal Lab Results:  Lab values include: Group B Strep negative Other Comments:  None  Review of Systems  Constitutional:       Uncomfortable with labor  HENT: Negative.   Eyes: Negative.   Respiratory: Negative.   Cardiovascular: Negative.   Gastrointestinal: Positive for abdominal  pain.  Genitourinary: Negative.   Musculoskeletal:       Pain with contractions  Skin: Negative.   Neurological: Negative.   Psychiatric/Behavioral: Negative.    Maternal Medical History:  Reason for admission: Rupture of membranes and contractions.   Contractions: Frequency: regular.   Perceived severity is strong.    Fetal activity: Perceived fetal activity is normal.    Prenatal Complications - Diabetes: gestational. Diabetes is managed by diet.      Dilation: 7 Effacement (%): 90 Station: -1 Exam by:: Roxan Hockey RN Last menstrual period 02/05/2017, unknown if currently breastfeeding. Maternal Exam:  Uterine Assessment: Contraction strength is moderate.  Contraction frequency is regular.   Abdomen: Patient reports no abdominal tenderness. Fundal height is appropriate for gestation.   Estimated fetal weight is 8-9#.   Fetal presentation: vertex  Introitus: Normal vulva. Normal vagina.    Physical Exam  Constitutional: She is oriented to person, place, and time. She appears well-developed and well-nourished.  HENT:  Head: Normocephalic and atraumatic.  Cardiovascular: Normal rate and regular rhythm.  Respiratory: Breath sounds normal. No respiratory distress. She has no wheezes.  GI: Soft. Bowel sounds are normal. She exhibits no distension. There is no tenderness.  Musculoskeletal: Normal range  of motion.  Neurological: She is alert and oriented to person, place, and time.  Skin: Skin is warm and dry.  Psychiatric: She has a normal mood and affect. Her behavior is normal.    Prenatal labs: ABO, Rh: O/Positive/-- (10/30 0000) Antibody: Negative (10/30 0000) Rubella: Immune (10/30 0000) RPR: Nonreactive (10/30 0000)  HBsAg: Negative (10/30 0000)  HIV: Non-reactive (10/30 0000)  GBS: Negative (04/17 0000)  Hgb 11.9/Plt 259/Ur Cx negChl neg/GC neg/Hgb electro WNL/Pap WNL HR HPV neg  Dated by LMP consistent with early [place]  First tri Scr and NT WNL, AFP  WNL/essential panel (CF, SMA and Fragile X neg)  [place] nl anat, female  Assessment/Plan: 32yo G3P1011 at 39+ in active labor gbbs neg Epidural prn GDM will follow CBG - poss glucommander Expect SVD    Abigail Knight 11/11/2017, 7:04 AM

## 2017-11-11 NOTE — MAU Note (Signed)
I have communicated with Dr Ellyn Hack and reviewed vital signs:  Vitals:   11/11/17 0208 11/11/17 0402  BP: 121/78 121/74  Pulse: (!) 101   Resp: 18   Temp: 99 F (37.2 C)     Vaginal exam:  Dilation: 2 Effacement (%): 70 Cervical Position: Posterior Presentation: Vertex Exam by:: Woodroe Chen RN ,   Also reviewed contraction pattern and that non-stress test is reactive.  It has been documented that patient is contracting irregularly with no cervical change since last office visit not indicating active labor.  Patient denies any other complaints.  Based on this report provider has given order for discharge.  A discharge order and diagnosis entered by a provider.   Labor discharge instructions reviewed with patient.

## 2017-11-11 NOTE — Progress Notes (Signed)
[employer] interpreter used for entire stay at MAU.

## 2017-11-11 NOTE — Progress Notes (Signed)
Dr Ellyn Hack called to come in for delivery.

## 2017-11-11 NOTE — MAU Provider Note (Signed)
S: Abigail Knight is a 32 y.o. G3P1011 at [redacted]w[redacted]d  who presents to MAU today complaining of leaking of fluid since 1200 on 11/10/17. She denies vaginal bleeding. She endorses contractions. She reports normal fetal movement.    O: BP 121/78   Pulse (!) 101   Temp 99 F (37.2 C)   Resp 18   Ht 5' (1.524 m)   Wt 160 lb (72.6 kg)   LMP 02/05/2017   BMI 31.25 kg/m  GENERAL: Well-developed, well-nourished female in no acute distress.  HEAD: Normocephalic, atraumatic.  CHEST: Normal effort of breathing, regular heart rate ABDOMEN: Soft, nontender, gravid PELVIC: Normal external female genitalia. Vagina is pink and rugated. Cervix with normal contour, no lesions. Normal discharge. NO pooling.   Cervical exam:  2/70  Fetal Monitoring: Baseline: 135 Variability: moderate Accelerations: 15x15 Decelerations: none Contractions: irregular  Results for orders placed or performed during the hospital encounter of 11/11/17 (from the past 24 hour(s))  Fern Test     Status: Normal   Collection Time: 11/11/17  3:38 AM  Result Value Ref Range   POCT Fern Test Negative = intact amniotic membranes      A: SIUP at [redacted]w[redacted]d  Membranes intact  P: Report given to RN to contact MD on call for further instructions  Armando Reichert, CNM 11/11/2017 3:38 AM

## 2017-11-12 LAB — CBC
HEMATOCRIT: 28.1 % — AB (ref 36.0–46.0)
Hemoglobin: 9 g/dL — ABNORMAL LOW (ref 12.0–15.0)
MCH: 28.8 pg (ref 26.0–34.0)
MCHC: 32 g/dL (ref 30.0–36.0)
MCV: 89.8 fL (ref 78.0–100.0)
Platelets: 180 10*3/uL (ref 150–400)
RBC: 3.13 MIL/uL — ABNORMAL LOW (ref 3.87–5.11)
RDW: 15.6 % — AB (ref 11.5–15.5)
WBC: 17.9 10*3/uL — ABNORMAL HIGH (ref 4.0–10.5)

## 2017-11-12 MED ORDER — PRENATAL VITAMINS 28-0.8 MG PO TABS: 1.0000 | ORAL_TABLET | Freq: Every day | ORAL | 3 refills | Status: AC

## 2017-11-12 MED ORDER — IBUPROFEN 600 MG PO TABS: 600.0000 mg | ORAL_TABLET | Freq: Four times a day (QID) | ORAL | 1 refills | Status: DC | PRN

## 2017-11-12 NOTE — Lactation Note (Signed)
This note was copied from a baby's chart. Lactation Consultation Note  Patient Name: Abigail Knight Date: 11/12/2017 Reason for consult: Initial assessment   FOB interpreting.   P2, Baby 28 hours old.  Mother breastfed first child for 23 mos. Denies problems or questions. Mom encouraged to feed baby 8-12 times/24 hours and with feeding cues.  Reviewed engorgement care and monitoring voids/stools.     Maternal Data Has patient been taught Hand Expression?: Yes Does the patient have breastfeeding experience prior to this delivery?: Yes  Feeding Feeding Type: Breast Fed Length of feed: 10 min(per mom)  LATCH Score Latch: Grasps breast easily, tongue down, lips flanged, rhythmical sucking.  Audible Swallowing: Spontaneous and intermittent  Type of Nipple: Everted at rest and after stimulation  Comfort (Breast/Nipple): Soft / non-tender  Hold (Positioning): No assistance needed to correctly position infant at breast.  LATCH Score: 10  Interventions Interventions: Breast feeding basics reviewed  Lactation Tools Discussed/Used     Consult Status Consult Status: Complete    Hardie Pulley 11/12/2017, 12:46 PM

## 2017-11-12 NOTE — Progress Notes (Addendum)
Post Partum Day 1 Subjective: no complaints, up ad lib, voiding, tolerating PO and nl lochia, pain controlled  Pt and FOB decline interpreter - desire d/c to home.    Objective: Blood pressure 116/78, pulse 82, temperature 97.8 F (36.6 C), temperature source Oral, resp. rate 16, last menstrual period 02/05/2017, SpO2 100 %, unknown if currently breastfeeding.  Physical Exam:  General: alert and no distress Lochia: appropriate Uterine Fundus: firm  Recent Labs    11/11/17 0659 11/12/17 0522  HGB 11.2* 9.0*  HCT 34.6* 28.1*    Assessment/Plan: Plan for discharge tomorrow, Breastfeeding and Lactation consult.  Routine PP care.  Desires d/c - will d/c if OK with peds.  D/C with motrin and PNV.     LOS: 1 day   Abigail Knight 11/12/2017, 8:03 AM

## 2017-11-12 NOTE — Discharge Summary (Signed)
OB Discharge Summary     Patient Name: Abigail Knight DOB: 01/26/1986 MRN: [id number]  Date of admission: 11/11/2017 Delivering MD: Sherian Rein   Date of discharge: 11/12/2017  Admitting diagnosis: 40 wks labor Intrauterine pregnancy: [redacted]w[redacted]d     Secondary diagnosis:  Principal Problem:   SVD (spontaneous vaginal delivery) Active Problems:   Pregnancy   Normal pregnancy in multigravida in third trimester  Additional problems: N/A     Discharge diagnosis: Term Pregnancy Delivered                                                                                                Post partum procedures:N/A  Augmentation: N/A  Complications: None  Hospital course:  Onset of Labor With Vaginal Delivery     32 y.o. yo [id number] at [redacted]w[redacted]d was admitted in Active Labor on 11/11/2017. Patient had an uncomplicated labor course as follows:  Membrane Rupture Time/Date: 6:00 AM ,11/11/2017   Intrapartum Procedures: Episiotomy: None [1]                                         Lacerations:  2nd degree [3]  Patient had a delivery of a Viable infant. 11/11/2017  Information for the patient's newborn:  Leilene, Diprima [[id number]]  Delivery Method: Vag-Spont    Pateint had an uncomplicated postpartum course.  She is ambulating, tolerating a regular diet, passing flatus, and urinating well. Patient is discharged home in stable condition on 11/12/17.   Physical exam  Vitals:   11/11/17 1105 11/11/17 1506 11/11/17 2020 11/12/17 0520  BP: 124/85 132/78 117/83 116/78  Pulse: 98 99 77 82  Resp: Temp: 99.5 F (37.5 C) 98.3 F (36.8 C) 98.1 F (36.7 C) 97.8 F (36.6 C)  TempSrc: Oral Oral  Oral  SpO2: 100% 99% 100% 100%   General: alert and no distress Lochia: appropriate Uterine Fundus: firm  Labs: Lab Results  Component Value Date   WBC 17.9 (H) 11/12/2017   HGB 9.0 (L) 11/12/2017   HCT 28.1 (L) 11/12/2017   MCV 89.8 11/12/2017   PLT 180 11/12/2017   No  flowsheet data found.  Discharge instruction: per After Visit Summary and "Baby and Me Booklet".  After visit meds:  Allergies as of 11/12/2017   No Known Allergies     Medication List    TAKE these medications   acetaminophen 325 MG tablet Commonly known as:  TYLENOL Take 325 mg by mouth every 6 (six) hours as needed for moderate pain.   ibuprofen 600 MG tablet Commonly known as:  ADVIL,MOTRIN Take 1 tablet (600 mg total) by mouth every 6 (six) hours as needed.   Prenatal Vitamins 28-0.8 MG Tabs Take 1 tablet by mouth daily. What changed:  medication strength       Diet: routine diet  Activity: Advance as tolerated. Pelvic rest for 6 weeks.   Outpatient follow up:6 weeks Follow up Appt:No future appointments. Follow up Visit:No follow-ups on file.  Postpartum  contraception: Undecided  Newborn Data: Live born female  Birth Weight: 8 lb 4.5 oz (3755 g) APGAR: 9, 9  Newborn Delivery   Birth date/time:  11/11/2017 07:53:00 Delivery type:  Vaginal, Spontaneous     Baby Feeding: Breast Disposition:home with mother   11/12/2017 Sherian Rein, MD

## 2017-11-14 ENCOUNTER — Inpatient Hospital Stay (HOSPITAL_COMMUNITY): Admission: RE | Admit: 2017-11-14 | Payer: Medicaid Other | Source: Ambulatory Visit

## 2018-09-03 IMAGING — US US OB TRANSVAGINAL
1 series · 15 of 28 positions shown · non-contrast
Comparison: 08/06/2016

CLINICAL DATA: Increased vaginal bleeding today. Estimated
gestational age by LMP is 8 weeks 5 days. Quantitative beta HCG is
6,073 today in comparison to [DATE] on 08/06/2016.

EXAM:
TRANSVAGINAL OB ULTRASOUND
TECHNIQUE: Transvaginal ultrasound was performed for complete evaluation of the
gestation as well as the maternal uterus, adnexal regions, and
pelvic cul-de-sac.

[Series 1: us ob transvaginal · 15 of 35 slices shown]
[im 1/35]
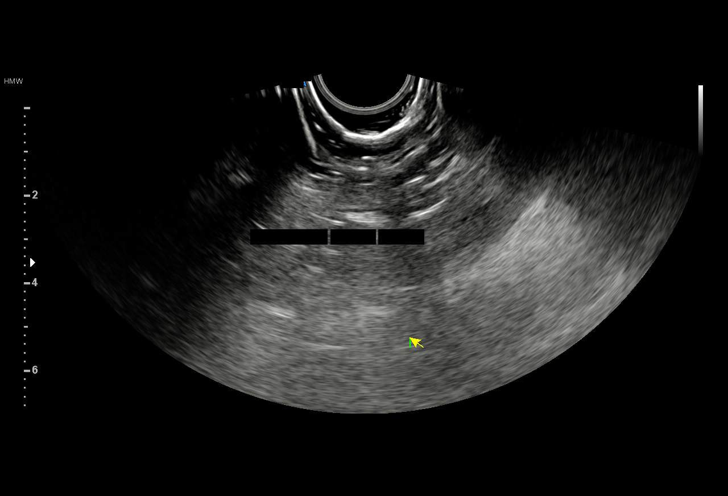
[im 3/35]
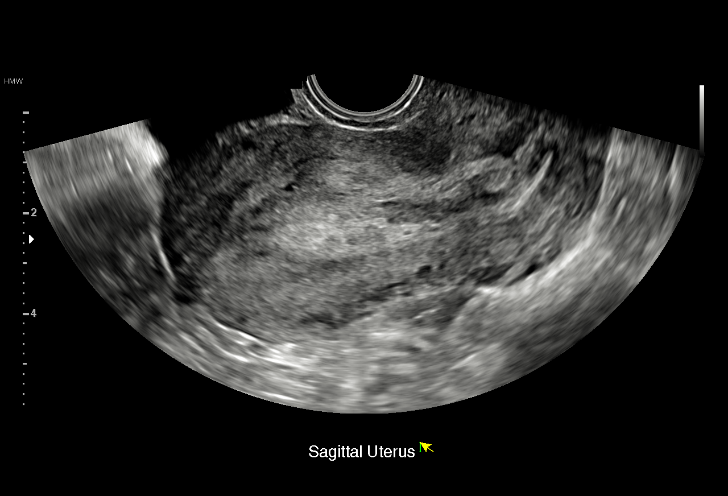
[im 6/35]
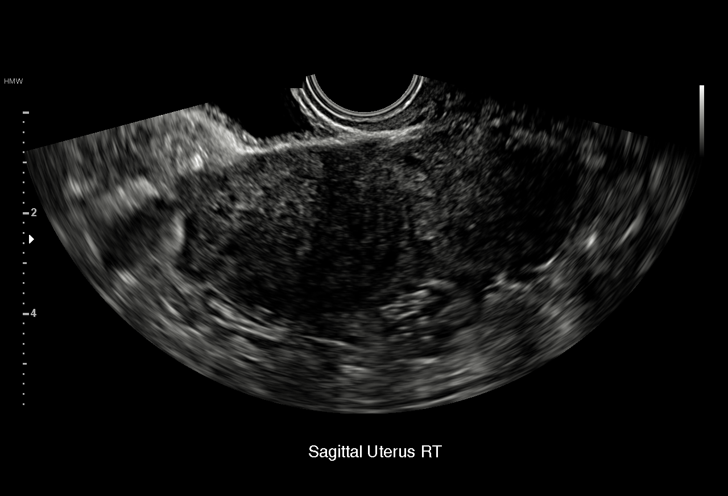
[im 8/35]
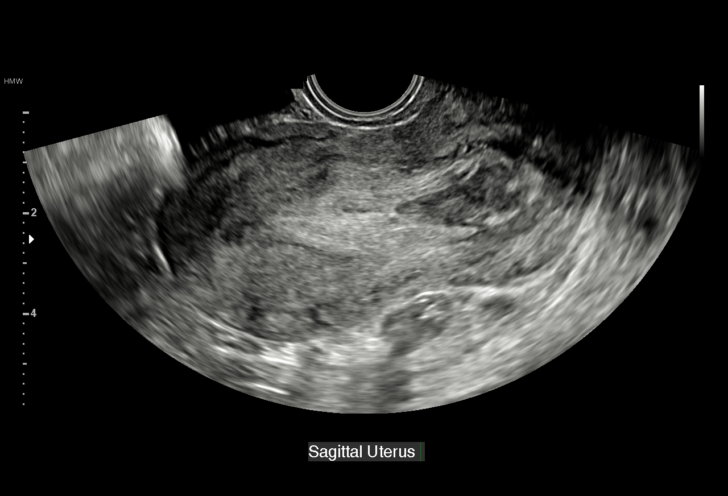
[im 11/35]
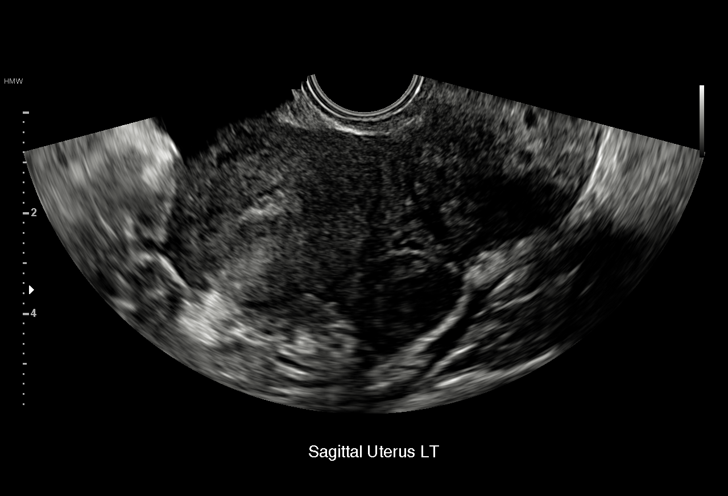
[im 13/35]
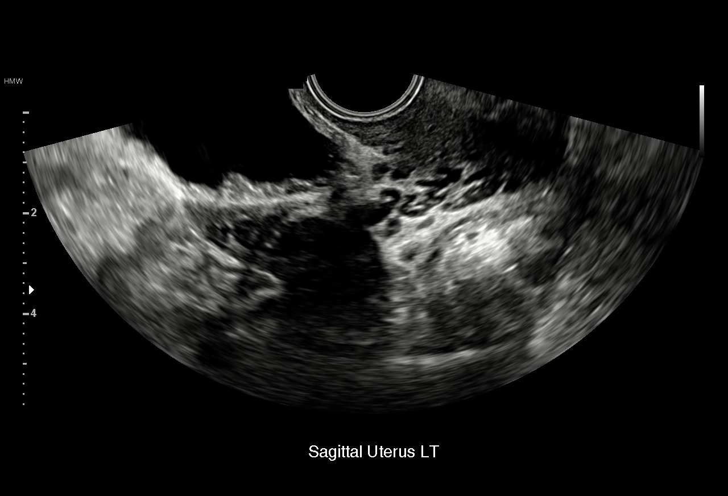
[im 16/35]
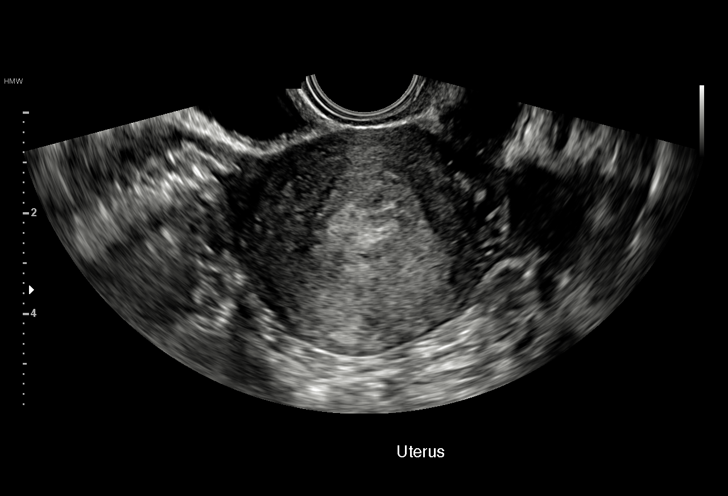
[im 18/35]
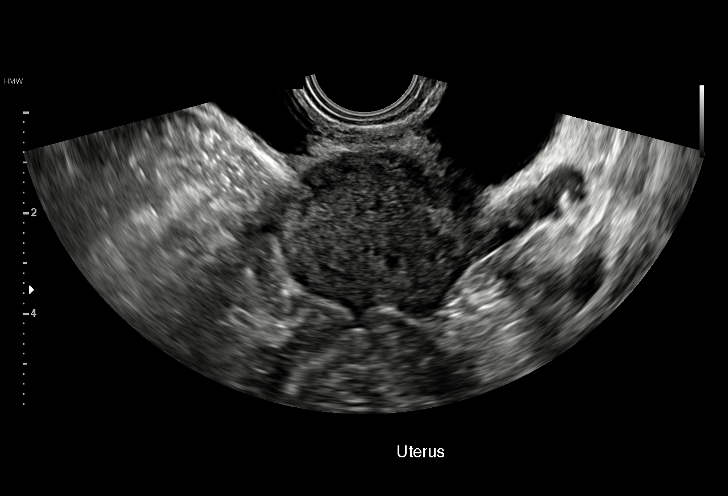
[im 19/35]
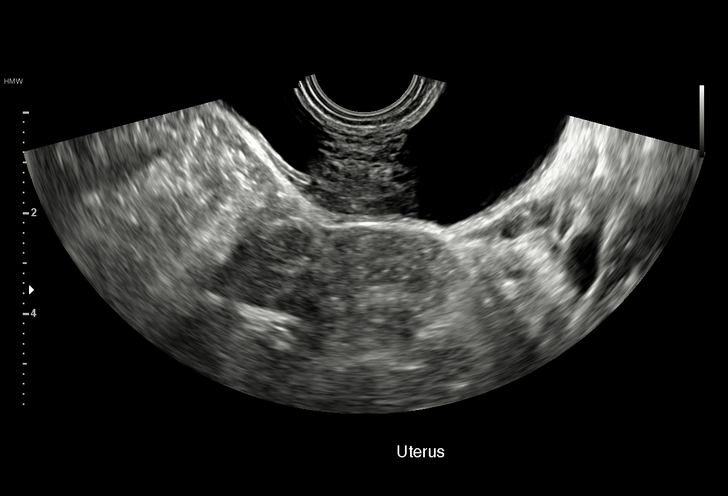
[im 22/35]
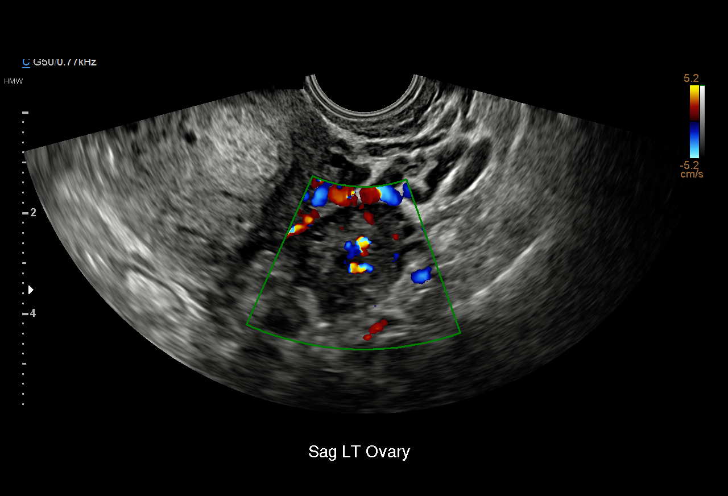
[im 24/35]
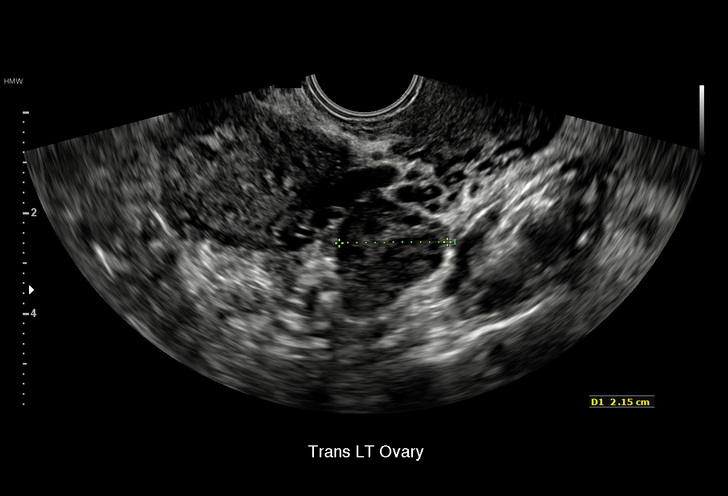
[im 27/35]
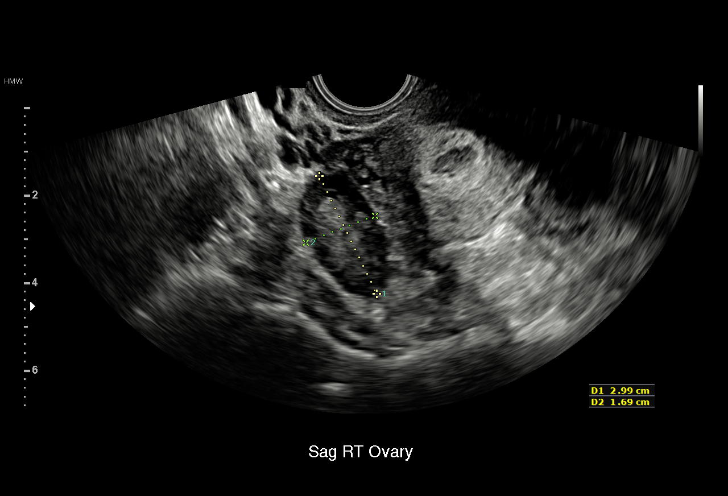
[im 29/35]
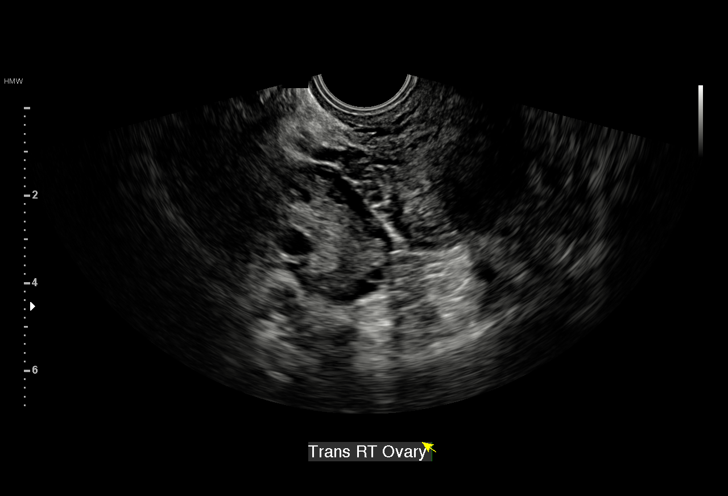
[im 32/35]
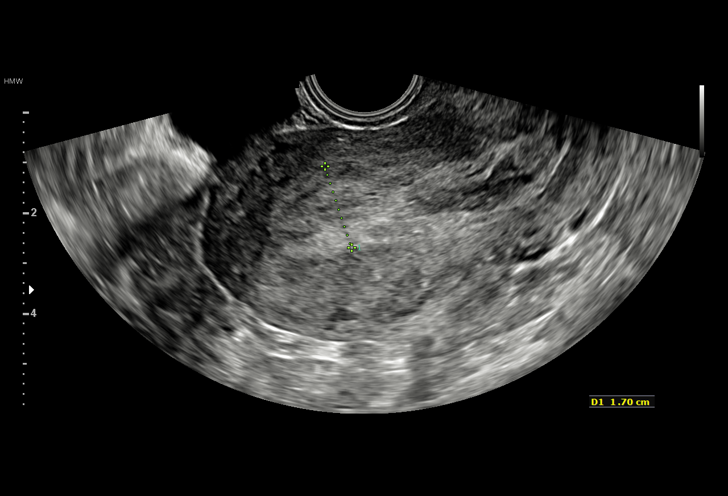
[im 35/35]
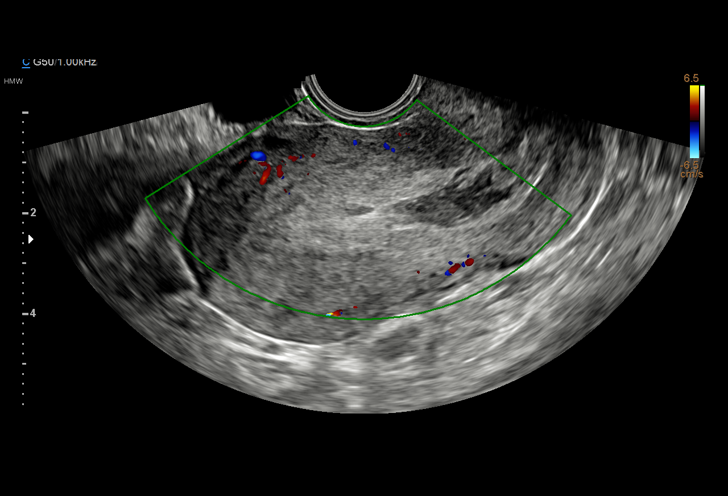

[15 of 28 positions shown; findings below may reference images not displayed]

FINDINGS: Intrauterine gestational sac: No intrauterine gestational sac
identified.

Yolk sac:  Not Visualized.

Embryo:  Not Visualized.

Cardiac Activity: Not Visualized.

Maternal uterus/adnexae: No myometrial mass lesions. Endometrium is
somewhat thickened at 17 mm and heterogeneous with mixed echotexture
material demonstrated in the lower uterine segment and endocervical
region. Both ovaries are visualized and appear normal. No abnormal
adnexal masses. No free pelvic fluid.
IMPRESSION: No intrauterine gestational sac identified with heterogeneous
material in the lower uterine segment and decreasing quantitative
beta HCG levels. Findings are consistent with interval loss of
pregnancy.

## 2019-06-28 NOTE — L&D Delivery Note (Addendum)
OB/GYN Faculty Practice Delivery Note  Abigail Knight is a 34 y.o. [id number] s/p SVD at [redacted]w[redacted]d. She was admitted for IOL for BPP 6/8.   ROM: 6h 55m with clear fluid GBS Status: Positive, penicillin Maximum Maternal Temperature: 98.6 F  Labor Progress: . Foley bulb placed and received 2 doses of Cytotec for cervical ripening.  Started on pitocin.  Had SROM.  Progressed to complete without complication.  Given history of postpartum hemorrhage, she received TXA about 30 minutes prior to delivery.  Delivery Date/Time: 12/14/2019 at 1244 Delivery: Called to room and patient was complete and pushing. Head delivered ROP. No nuchal cord present. Shoulder and body delivered in usual fashion. Immediately after delivery of infant, passed a large amount of dark maroon clot and she was given a dose of Methergine.  Infant with spontaneous cry, placed on mother's abdomen, dried and stimulated. Cord clamped x 2 after about 30 second delay, and cut by FOB under my direct supervision. Infant was handed of to NICU staff. Cord gas and blood drawn. Placenta delivered spontaneously with gentle cord traction. Fundus firm with massage and Pitocin. Labia, perineum, vagina, and cervix were inspected, found to have a first degree perineal laceration that was repaired with 3-0 Vicryl in the usual fashion with hemostasis.  Post-placental IUD placed and Bartholin gland abscess drained, see separate procedure notes for details.   Placenta: Intact, 3 vessel cord.  Noted to have a large amount of dark maroon clot, suspicious for partial placental abruption. Sent to pathology.  Complications: Postpartum hemorrhage Lacerations: First degree perineal QBL: 1422 mL Analgesia: Epidural Arterial Cord pH: 7.322  Infant: Viable female  APGARs 3/9  per medical record   EMILY Genene Churn, MD PGY-2 Resident, Family Medicine 12/14/2019, 1:12 PM  OB FELLOW DELIVERY ATTESTATION  I was gloved and present for the delivery in its entirety, and I  agree with the above resident's note. Large dark clots delivered immediately after infant delivery concerning for placental abruption. Placenta sent to pathology. TXA had been given prior to delivery due to hx of PPH. Fundus firm after delivery but due to amount of blood already lost just after delivery, Methergine given. IUD placed and then perineal repair done and finally I&D of Bartholin cyst done; see separate notes.  Jerilynn Birkenhead, MD [facility] Fellow, [facility] for [employer], [facility]

## 2019-07-29 LAB — QUANTIFERON-TB GOLD PLUS

## 2019-07-29 LAB — OB RESULTS CONSOLE ABO/RH: RH Type: POSITIVE

## 2019-07-29 LAB — HEMOGLOBIN EVAL RFX ELECTROPHORESIS
Cystic Fibrosis Profile: NEGATIVE
Drug Screen, Urine: NEGATIVE
Glucose 1 Hour: 163
Hemoglobin Evaluation: NORMAL
Pap Smear: NEGATIVE
Urine Culture, OB: NEGATIVE

## 2019-07-29 LAB — OB RESULTS CONSOLE RPR: RPR: NONREACTIVE

## 2019-07-29 LAB — OB RESULTS CONSOLE GC/CHLAMYDIA
Chlamydia: NEGATIVE
Gonorrhea: NEGATIVE

## 2019-07-29 LAB — OB RESULTS CONSOLE PLATELET COUNT: Platelets: 270

## 2019-07-29 LAB — OB RESULTS CONSOLE HIV ANTIBODY (ROUTINE TESTING): HIV: NONREACTIVE

## 2019-07-29 LAB — OB RESULTS CONSOLE HGB/HCT, BLOOD
HCT: 37 (ref 29–41)
Hemoglobin: 12

## 2019-07-29 LAB — OB RESULTS CONSOLE ANTIBODY SCREEN: Antibody Screen: NEGATIVE

## 2019-07-29 LAB — OB RESULTS CONSOLE VARICELLA ZOSTER ANTIBODY, IGG: Varicella: IMMUNE

## 2019-07-29 LAB — HEPATITIS C ANTIBODY: HCV Ab: NEGATIVE

## 2019-07-29 LAB — OB RESULTS CONSOLE RUBELLA ANTIBODY, IGM: Rubella: IMMUNE

## 2019-07-29 LAB — OB RESULTS CONSOLE HEPATITIS B SURFACE ANTIGEN: Hepatitis B Surface Ag: NEGATIVE

## 2019-08-01 LAB — GLUCOSE, 3 HOUR GESTATIONAL
Glucose 1 Hour: 178
Glucose 2 Hour: 159
Glucose 3 Hour: 85
Glucose Tolerance, Fasting: 84 (ref 70–99)

## 2019-08-05 ENCOUNTER — Other Ambulatory Visit: Payer: Self-pay

## 2019-08-05 ENCOUNTER — Ambulatory Visit (HOSPITAL_COMMUNITY): Payer: Medicaid Other | Attending: Obstetrics and Gynecology

## 2019-08-05 DIAGNOSIS — Z843 Family history of consanguinity: Secondary | ICD-10-CM

## 2019-08-05 DIAGNOSIS — Z3A19 19 weeks gestation of pregnancy: Secondary | ICD-10-CM

## 2019-08-05 DIAGNOSIS — Z36 Encounter for antenatal screening for chromosomal anomalies: Secondary | ICD-10-CM

## 2019-08-05 DIAGNOSIS — Z31430 Encounter of female for testing for genetic disease carrier status for procreative management: Secondary | ICD-10-CM

## 2019-08-05 DIAGNOSIS — Z315 Encounter for genetic counseling: Secondary | ICD-10-CM

## 2019-08-05 NOTE — Progress Notes (Addendum)
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 Abigail Knight was accompanied to her appointment by a [facility] Arabic interpreter.   Indication for genetic counseling - History of consanguinity  Prenatal history  Abigail Knight is a [id number], 34 y.o. female. Her current pregnancy has completed [redacted]w[redacted]d (Estimated Date of Delivery: 12/27/19).  Abigail Knight denied exposure to environmental toxins or chemical agents. She denied the use of alcohol, tobacco or street drugs. She reported taking prenatal vitamins. She denied significant viral illnesses, fevers, and bleeding during the course of her pregnancy. Her medical and surgical histories were noncontributory.  Family History  A three generation pedigree was not drafted today due to time constraints. However, the family history is remarkable for the following:  - Abigail Knight's father is siblings with her husband's father. This makes Abigail Knight and herhusband firstcousins to one another. See Discussion section for more details.  The remaining family histories were reviewed and found to be noncontributory for birth defects, intellectual disability, recurrent pregnancy loss, and known genetic conditions.    The patient's ethnicity is [place]. The father of the pregnancy's ethnicity is [place]. Ashkenazi Jewish ancestry was denied.   Discussion  Abigail Knight was referred for genetic counseling due to a history of consanguinity, as she and her husband are first cousins.  We discussed that children born to a consanguineous couple are at increased risk for genetic health problems. This increase in risk is related to the possibility of passing on recessive genes. Every person carries approximately 5-10 non-working genes that, when received in a double dose, result in recessive genetic conditions. In general,  unrelated couples have a relatively low risk of having a child with a recessive condition because the likelihood of both parents carrying the same non-working recessive gene is very low. However, when a couple is related, they have inherited some of their genetic information from the same family member, which leads to an increased chance that they may carry the same recessive gene and thus may have a child with a recessive condition.   For first cousin unions, the chance of having a child with a birth defect, intellectual disability, or genetic condition is increased approximately 2-4% above the general population risk of 3-5%. First cousins share approximately 12.5% (1/8) of their genetic material. Children of first cousin unions are homozygous at 6.25% (1/16) of their gene loci. If these loci are associated with recessive genetic conditions, then there is a chance that the children will be affected by that condition. If Abigail Knight and her husband were to carry the same recessive condition, they would have a 1 in 4 (25%) chance of having a child with that condition.  We discussed that expanded carrier screening (ECS) is a testing option that evaluates an individual's carrier status for >100 autosomal recessive or X-linked genetic conditions. Some of the conditions included on ECS are rare while others may occur more commonly. Some conditions may be severe and actionable, whereas other conditions may not yet be well-understood or may not have treatment options available. In the event that an individual were found to be a carrier for one or more conditions, carrier screening would be recommended for their partner for those conditions. This could help determine whether a couple's pregnancies are at increased risk for a particular recessive genetic condition. Additionally, carrier status for certain conditions may have health implications  for the carrier themselves. We discussed the risks, benefits, and limitations  of ECS. After thoughtful consideration of her options, Abigail Knight indicated that she was interested in pursuing ECS. She understands that she likely will be identified as a carrier for a condition, and that if she is found to be a carrier, follow-up screening for her husband will be recommended.  We also reviewed noninvasive prenatal screening (NIPS) as an available screening option for chromosomal aneuploidies unrelated to a couple's risk for recessive conditions. NIPS analyzes cell free DNA originating from the placenta that is found in the maternal blood circulation during pregnancy. This test is not diagnostic for chromosome conditions, but can provide information regarding the presence or absence of extra fetal DNA for chromosomes 13, 18, 21, and the sex chromosomes. Thus, it would not identify or rule out all fetal aneuploidy. The reported detection rate is 91-99% for trisomies 21, 18, 13, and sex chromosome aneuploidies. The false positive rate is reported to be less than 0.1% for any of these conditions. Abigail Knight indicated that she is interested in undergoing NIPS.  Due to time constraints, my discussion with Abigail Knight was cut short today. She had her blood drawn for MaterniT21 NIPS and Inheritest expanded carrier screening following our appointment. Results from NIPS will take 5-7 days to be returned. Inheritest results will take 2-3 weeks to be returned. I will call Abigail Knight when results become available.   I counseled Abigail Knight regarding the above risks and available options. The approximate face-to-face time with the genetic counselor was 25 minutes.  In summary:  Reviewed family history concerns  Patient and her husband are first cousins  Discussed expanded carrier screening for recessive conditions  First-cousin unions are at increased risk to have a child with a recessive condition  Opted to undergo Inheritest-144 expanded carrier screening. We will follow results  Offered  additional screening/testing options  Opted to undergo MaterniT21 NIPS. We will follow results   Gershon Crane, MS Genetic Counselor

## 2019-08-14 ENCOUNTER — Telehealth (HOSPITAL_COMMUNITY): Payer: Self-pay | Admitting: Genetic Counselor

## 2019-08-14 NOTE — Telephone Encounter (Signed)
Attempted to call Ms. Gross with the help of Arabic [address] [place], [place] [id number] re: good news about her screening results. However, there was no answer on either cellphone or house phone number listed in patient's chart. LVM on line listed as house number requesting a call back to my direct line to discuss these results in more detail. I will try to call again on Friday if I do not hear back from the patient by then.   Gershon Crane, MS Genetic Counselor

## 2019-08-16 ENCOUNTER — Telehealth (HOSPITAL_COMMUNITY): Payer: Self-pay | Admitting: Genetic Counselor

## 2019-08-16 NOTE — Telephone Encounter (Signed)
I called Ms. Scruton and her husband to discuss her negative noninvasive prenatal screening (NIPS)/cell free DNA (cfDNA) testing result with the help of [address] [place], [place] [address]. Specifically, Ms. Quadros had MaterniT21 testing through [employer]. These negative results demonstrated an expected representation of chromosome 21, 18, 13, and sex chromosome material, greatly reducing the likelihood of trisomies 75, 28, or 62 and sex chromosome aneuploidies for the pregnancy. Ms. Kerin requested to know about the expected fetal sex, which is female.  NIPS analyzes placental (fetal) DNA in maternal circulation. NIPS is considered to be highly specific and sensitive, but is not considered to be a diagnostic test. We reviewed that this testing identifies 91-99% of pregnancies with trisomies 6, 64, and 63, as well as sex chromosome abnormalities, but does not test for all genetic conditions. Diagnostic testing via amniocentesis is available should she be interested in confirming this result. She confirmed that she had no questions about these results at this time. I let the couple know that I will be calling them in a few weeks when Ms. Sauerwein's results from expanded carrier screening are returned.  Gershon Crane, MS Genetic Counselor

## 2019-08-19 LAB — MATERNIT21 PLUS CORE+SCA
Fetal Fraction: 11
Monosomy X (Turner Syndrome): NOT DETECTED
Result (T21): NEGATIVE
Trisomy 13 (Patau syndrome): NEGATIVE
Trisomy 18 (Edwards syndrome): NEGATIVE
Trisomy 21 (Down syndrome): NEGATIVE
XXX (Triple X Syndrome): NOT DETECTED
XXY (Klinefelter Syndrome): NOT DETECTED
XYY (Jacobs Syndrome): NOT DETECTED

## 2019-08-19 LAB — INHERITEST COMPREHENSIVE

## 2019-08-27 ENCOUNTER — Telehealth (HOSPITAL_COMMUNITY): Payer: Self-pay | Admitting: Genetic Counselor

## 2019-08-27 NOTE — Telephone Encounter (Signed)
I called Ms. Montel with the help of an [employer], ID# [phone] to discuss her negative expanded carrier screening (ECS) results. Specifically, Ms. Gunderman had Inheritest-144 ECS performed through [facility] for her history of consanguinity. Ms. Rupard was negative for the mutations analyzed in 144 genes associated with more than 115 autosomal recessive and X-linked disorders (listed separately in the laboratory report). We discussed that these negative results means that her risk to be a carrier for these conditions has been reduced, but not fully eliminated. This also significantly reduces the couple's risk of having a child affected by one of these 115 conditions. Ms. Vanzandt understands that this test does not assess for all possible genetic conditions.   Given that Ms. Rothert's results were negative, it is not necessary to perform carrier screening for her husband for any of the 115 conditions that Ms. Mergenthaler was tested for at this time. Ms. Ehler confirmed that she had no questions about these results.   Gershon Crane, MS, [facility] Genetic Counselor

## 2019-09-05 ENCOUNTER — Ambulatory Visit
Admission: RE | Admit: 2019-09-05 | Discharge: 2019-09-05 | Disposition: A | Discharge status: Home / Self Care | Payer: Self-pay | Source: Ambulatory Visit | Attending: Obstetrics and Gynecology | Admitting: Obstetrics and Gynecology

## 2019-09-05 ENCOUNTER — Other Ambulatory Visit: Payer: Self-pay | Admitting: Obstetrics and Gynecology

## 2019-09-05 ENCOUNTER — Other Ambulatory Visit: Payer: Self-pay

## 2019-09-05 DIAGNOSIS — R7612 Nonspecific reaction to cell mediated immunity measurement of gamma interferon antigen response without active tuberculosis: Secondary | ICD-10-CM

## 2019-10-07 LAB — GLUCOSE, 3 HOUR GESTATIONAL
Glucose 1 Hour: 216
Glucose 2 Hour: 203
Glucose 3 Hour: 134
Glucose Tolerance, Fasting: 79 (ref 70–99)

## 2019-10-07 LAB — OB RESULTS CONSOLE HGB/HCT, BLOOD
HCT: 37 (ref 29–41)
Hemoglobin: 12.2

## 2019-10-07 LAB — OB RESULTS CONSOLE RPR: RPR: NONREACTIVE

## 2019-10-14 ENCOUNTER — Encounter: Payer: Self-pay | Admitting: *Deleted

## 2019-10-14 DIAGNOSIS — O0990 Supervision of high risk pregnancy, unspecified, unspecified trimester: Secondary | ICD-10-CM | POA: Insufficient documentation

## 2019-10-14 DIAGNOSIS — O09299 Supervision of pregnancy with other poor reproductive or obstetric history, unspecified trimester: Secondary | ICD-10-CM | POA: Insufficient documentation

## 2019-10-14 DIAGNOSIS — O24419 Gestational diabetes mellitus in pregnancy, unspecified control: Secondary | ICD-10-CM

## 2019-10-14 DIAGNOSIS — R7611 Nonspecific reaction to tuberculin skin test without active tuberculosis: Secondary | ICD-10-CM

## 2019-10-14 DIAGNOSIS — Z8759 Personal history of other complications of pregnancy, childbirth and the puerperium: Secondary | ICD-10-CM

## 2019-10-14 DIAGNOSIS — Z8632 Personal history of gestational diabetes: Secondary | ICD-10-CM

## 2019-10-22 ENCOUNTER — Encounter: Payer: Medicaid Other | Admitting: Family Medicine

## 2019-10-22 ENCOUNTER — Telehealth: Payer: Self-pay | Admitting: Family Medicine

## 2019-10-22 NOTE — Telephone Encounter (Signed)
Attempted to contact patient w/ Arabic interpreter Hanan to get her rescheduled for her missed ob appointment. No answer, interpreter left a voicemail for patient to give the office a call back to be rescheduled.

## 2019-11-26 ENCOUNTER — Ambulatory Visit (INDEPENDENT_AMBULATORY_CARE_PROVIDER_SITE_OTHER): Payer: Medicaid Other | Admitting: Obstetrics and Gynecology

## 2019-11-26 ENCOUNTER — Other Ambulatory Visit: Payer: Self-pay

## 2019-11-26 ENCOUNTER — Other Ambulatory Visit: Payer: Self-pay | Admitting: Obstetrics and Gynecology

## 2019-11-26 ENCOUNTER — Encounter: Payer: Self-pay | Admitting: Obstetrics and Gynecology

## 2019-11-26 VITALS — BP 112/75 | HR 108 | Wt 172.9 lb

## 2019-11-26 DIAGNOSIS — Z8759 Personal history of other complications of pregnancy, childbirth and the puerperium: Secondary | ICD-10-CM

## 2019-11-26 DIAGNOSIS — O09293 Supervision of pregnancy with other poor reproductive or obstetric history, third trimester: Secondary | ICD-10-CM

## 2019-11-26 DIAGNOSIS — Z3A35 35 weeks gestation of pregnancy: Secondary | ICD-10-CM | POA: Diagnosis not present

## 2019-11-26 DIAGNOSIS — O24410 Gestational diabetes mellitus in pregnancy, diet controlled: Secondary | ICD-10-CM | POA: Diagnosis not present

## 2019-11-26 DIAGNOSIS — O0990 Supervision of high risk pregnancy, unspecified, unspecified trimester: Secondary | ICD-10-CM

## 2019-11-26 DIAGNOSIS — R7611 Nonspecific reaction to tuberculin skin test without active tuberculosis: Secondary | ICD-10-CM

## 2019-11-26 LAB — POCT URINALYSIS DIP (DEVICE)
Bilirubin Urine: NEGATIVE
Glucose, UA: NEGATIVE mg/dL
Hgb urine dipstick: NEGATIVE
Ketones, ur: NEGATIVE mg/dL
Leukocytes,Ua: NEGATIVE
Nitrite: NEGATIVE
Protein, ur: NEGATIVE mg/dL
Specific Gravity, Urine: 1.025 (ref 1.005–1.030)
Urobilinogen, UA: 0.2 mg/dL (ref 0.0–1.0)
pH: 7 (ref 5.0–8.0)

## 2019-11-26 MED ORDER — BLOOD PRESSURE KIT DEVI: 1.0000 | Freq: Once | 0 refills | Status: AC

## 2019-11-26 MED ORDER — ACCU-CHEK SOFTCLIX LANCETS MISC: 12 refills | Status: DC

## 2019-11-26 MED ORDER — ACCU-CHEK GUIDE W/DEVICE KIT: 1.0000 | PACK | Freq: Once | 0 refills | Status: AC

## 2019-11-26 MED ORDER — ACCU-CHEK GUIDE VI STRP: ORAL_STRIP | 12 refills | Status: DC

## 2019-11-26 NOTE — Progress Notes (Signed)
Subjective:  Abigail Knight is a 34 y.o. Y7X4128 at [redacted]w[redacted]d being seen today for ongoing prenatal care. Transferred from [facility] d/t GDM. H/O GDM in prior pregnancies diet control. TSVD x 2 without problems. EDD by LMP and confirmed by U/S.   She is currently monitored for the following issues for this high-risk pregnancy and has Pregnancy; Normal pregnancy in multigravida in third trimester; Supervision of high risk pregnancy, antepartum; GDM (gestational diabetes mellitus); History of postpartum hemorrhage; Positive TB test; and History of gestational diabetes mellitus (GDM) in prior pregnancy, currently pregnant on their problem list.  Patient reports no complaints.  Contractions: Not present. Vag. Bleeding: None.  Movement: Present. Denies leaking of fluid.   The following portions of the patient's history were reviewed and updated as appropriate: allergies, current medications, past family history, past medical history, past social history, past surgical history and problem list. Problem list updated.  Objective:   Vitals:   11/26/19 1028  BP: 112/75  Pulse: (!) 108  Weight: 172 lb 14.4 oz (78.4 kg)    Fetal Status: Fetal Heart Rate (bpm): 134   Movement: Present     General:  Alert, oriented and cooperative. Patient is in no acute distress.  Skin: Skin is warm and dry. No rash noted.   Cardiovascular: Normal heart rate noted  Respiratory: Normal respiratory effort, no problems with respiration noted  Abdomen: Soft, gravid, appropriate for gestational age. Pain/Pressure: Present     Pelvic:  Cervical exam deferred        Extremities: Normal range of motion.  Edema: Trace  Mental Status: Normal mood and affect. Normal behavior. Normal judgment and thought content.   Urinalysis:      Assessment and Plan:  Pregnancy: [id number] at [redacted]w[redacted]d  1. Supervision of high risk pregnancy, antepartum Stable  2. Positive TB test Negative CXR  3. Diet controlled gestational diabetes mellitus (GDM) in  third trimester To see DM educator today GDM and pregnancy reviewed with pt Importance of following diet CBG control and recording   4. History of postpartum hemorrhage   Preterm labor symptoms and general obstetric precautions including but not limited to vaginal bleeding, contractions, leaking of fluid and fetal movement were reviewed in detail with the patient. Please refer to After Visit Summary for other counseling recommendations.  Return in about 1 week (around 12/03/2019) for face to face, MD only, prefers female.   Hermina Staggers, MD

## 2019-11-26 NOTE — Addendum Note (Signed)
Addended by: Maxwell Marion E on: 11/26/2019 12:33 PM   Modules accepted: Orders

## 2019-11-26 NOTE — Addendum Note (Signed)
Addended by: Maxwell Marion E on: 11/26/2019 02:41 PM   Modules accepted: Orders

## 2019-11-28 ENCOUNTER — Other Ambulatory Visit: Payer: Self-pay

## 2019-11-28 ENCOUNTER — Ambulatory Visit: Payer: Medicaid Other | Admitting: Registered"

## 2019-11-28 ENCOUNTER — Encounter: Payer: Medicaid Other | Attending: Obstetrics and Gynecology | Admitting: Registered"

## 2019-11-28 DIAGNOSIS — O24419 Gestational diabetes mellitus in pregnancy, unspecified control: Secondary | ICD-10-CM | POA: Insufficient documentation

## 2019-11-28 DIAGNOSIS — Z713 Dietary counseling and surveillance: Secondary | ICD-10-CM | POA: Insufficient documentation

## 2019-11-28 DIAGNOSIS — Z3A35 35 weeks gestation of pregnancy: Secondary | ICD-10-CM | POA: Diagnosis not present

## 2019-11-28 NOTE — Progress Notes (Signed)
Interpreter services provided by Gwendolyn Fill from [employer]  Patient was seen on 11/28/19 for Gestational Diabetes self-management. EDD 12/27/19. Patient states prior history of diet controlled GDM. Diet history obtained. Patient diet is pretty well balanced but probably lacking in non-starchy vegetables. Beverages include water, occasional juice/soda.  Patient is likely consuming excess carbohydrates in the form of fruit.   The following learning objectives were met by the patient :   States the definition of Gestational Diabetes  States why dietary management is important in controlling blood glucose  Describes the effects of carbohydrates on blood glucose levels  Demonstrates ability to create a balanced meal plan  Demonstrates carbohydrate counting   States when to check blood glucose levels  Demonstrates proper blood glucose monitoring techniques  States the effect of stress and exercise on blood glucose levels  States the importance of limiting caffeine and abstaining from alcohol and smoking  Plan:  Aim for 3 Carbohydrate Choices per meal (45 grams) +/- 1 either way  Aim for 1-2 Carbohydrate Choices per snack Begin reading food labels for Total Carbohydrate of foods If OK with your MD, consider  increasing your activity level by walking, Arm Chair Exercises or other activity daily as tolerated Begin checking Blood Glucose before breakfast and 2 hours after first bite of breakfast, lunch and dinner as directed by MD  Bring Log Book/Sheet and meter to every medical appointment  Baby Scripts: Patient not appropriate for Baby Scripts due to language barrier  Take medication if directed by MD  Blood glucose monitor given: Accu-chek Guide Me Lot #[id number] Exp: 02/13/2021 Blood Glucose: 116 mg/dL  Blood glucose monitor Rx called into pharmacy: Accu Check Guide strips and Softclix lancets  Patient instructed to monitor glucose levels: FBS: 60 - 95 mg/dl 2 hour: <120  mg/dl  Patient received the following handouts:  Nutrition Diabetes and Pregnancy  Carbohydrate Counting List  Blood glucose Log Sheet  Patient will be seen for follow-up in as needed.

## 2019-12-03 ENCOUNTER — Other Ambulatory Visit: Payer: Self-pay

## 2019-12-03 ENCOUNTER — Encounter: Payer: Self-pay | Admitting: Medical

## 2019-12-03 ENCOUNTER — Encounter: Payer: Self-pay | Admitting: Family Medicine

## 2019-12-03 ENCOUNTER — Other Ambulatory Visit (HOSPITAL_COMMUNITY)
Admission: RE | Admit: 2019-12-03 | Discharge: 2019-12-03 | Disposition: A | Discharge status: Home / Self Care | Payer: Medicaid Other | Source: Ambulatory Visit | Attending: Medical | Admitting: Medical

## 2019-12-03 ENCOUNTER — Ambulatory Visit (INDEPENDENT_AMBULATORY_CARE_PROVIDER_SITE_OTHER): Payer: Medicaid Other | Admitting: Medical

## 2019-12-03 VITALS — BP 109/69 | HR 103 | Wt 173.6 lb

## 2019-12-03 DIAGNOSIS — Z843 Family history of consanguinity: Secondary | ICD-10-CM

## 2019-12-03 DIAGNOSIS — Z3A36 36 weeks gestation of pregnancy: Secondary | ICD-10-CM

## 2019-12-03 DIAGNOSIS — O24415 Gestational diabetes mellitus in pregnancy, controlled by oral hypoglycemic drugs: Secondary | ICD-10-CM

## 2019-12-03 DIAGNOSIS — O0993 Supervision of high risk pregnancy, unspecified, third trimester: Secondary | ICD-10-CM

## 2019-12-03 DIAGNOSIS — O24419 Gestational diabetes mellitus in pregnancy, unspecified control: Secondary | ICD-10-CM

## 2019-12-03 DIAGNOSIS — O0990 Supervision of high risk pregnancy, unspecified, unspecified trimester: Secondary | ICD-10-CM

## 2019-12-03 MED ORDER — METFORMIN HCL 1000 MG PO TABS: 1000.0000 mg | ORAL_TABLET | Freq: Two times a day (BID) | ORAL | 1 refills | Status: DC

## 2019-12-03 NOTE — Progress Notes (Signed)
Induction Assessment Scheduling Form: Fax to [facility]:  [phone] Route to [facility] Labor Delivery   Abigail Knight                                                                                   DOB:  Nov 14, 1985                                                            MRN:  [id number]  Phone:  Home Phone [phone]  Mobile [phone]    Provider:  [facility] (Faculty Practice)  Admission Date/Time:  12/20/19 AM GP:  [id number]     Gestational age on admission:  [redacted]w[redacted]d                                                Estimated Date of Delivery: 12/27/19  Dating Criteria: LMP  Filed Weights   12/03/19 1127  Weight: 173 lb 9.6 oz (78.7 kg)    GBS:   pending, obtained 12/03/19 HIV:  Non-reactive (02/01 0000)  Reason for induction:  GDM A2 Scheduling Provider Signature:  Vonzella Nipple, PA-C      Dilation: Closed Effacement (%): 50 Station: Ballotable  Method of induction(proposed):  Cytotec   Scheduling Provider Signature:  Vonzella Nipple, PA-C                                            Today's Date:  12/03/2019

## 2019-12-03 NOTE — Patient Instructions (Signed)
Fetal Movement Counts Patient Name: ________________________________________________ Patient Due Date: ____________________ What is a fetal movement count?  A fetal movement count is the number of times that you feel your baby move during a certain amount of time. This may also be called a fetal kick count. A fetal movement count is recommended for every pregnant woman. You may be asked to start counting fetal movements as early as week 28 of your pregnancy. Pay attention to when your baby is most active. You may notice your baby's sleep and wake cycles. You may also notice things that make your baby move more. You should do a fetal movement count:  When your baby is normally most active.  At the same time each day. A good time to count movements is while you are resting, after having something to eat and drink. How do I count fetal movements? 1. Find a quiet, comfortable area. Sit, or lie down on your side. 2. Write down the date, the start time and stop time, and the number of movements that you felt between those two times. Take this information with you to your health care visits. 3. Write down your start time when you feel the first movement. 4. Count kicks, flutters, swishes, rolls, and jabs. You should feel at least 10 movements. 5. You may stop counting after you have felt 10 movements, or if you have been counting for 2 hours. Write down the stop time. 6. If you do not feel 10 movements in 2 hours, contact your health care provider for further instructions. Your health care provider may want to do additional tests to assess your baby's well-being. Contact a health care provider if:  You feel fewer than 10 movements in 2 hours.  Your baby is not moving like he or she usually does. Date: ____________ Start time: ____________ Stop time: ____________ Movements: ____________ Date: ____________ Start time: ____________ Stop time: ____________ Movements: ____________ Date: ____________  Start time: ____________ Stop time: ____________ Movements: ____________ Date: ____________ Start time: ____________ Stop time: ____________ Movements: ____________ Date: ____________ Start time: ____________ Stop time: ____________ Movements: ____________ Date: ____________ Start time: ____________ Stop time: ____________ Movements: ____________ Date: ____________ Start time: ____________ Stop time: ____________ Movements: ____________ Date: ____________ Start time: ____________ Stop time: ____________ Movements: ____________ Date: ____________ Start time: ____________ Stop time: ____________ Movements: ____________ This information is not intended to replace advice given to you by your health care provider. Make sure you discuss any questions you have with your health care provider. Document Revised: 01/31/2019 Document Reviewed: 01/31/2019 Elsevier Patient Education  2020 Elsevier Inc. Braxton Hicks Contractions Contractions of the uterus can occur throughout pregnancy, but they are not always a sign that you are in labor. You may have practice contractions called Braxton Hicks contractions. These false labor contractions are sometimes confused with true labor. What are Braxton Hicks contractions? Braxton Hicks contractions are tightening movements that occur in the muscles of the uterus before labor. Unlike true labor contractions, these contractions do not result in opening (dilation) and thinning of the cervix. Toward the end of pregnancy (32-34 weeks), Braxton Hicks contractions can happen more often and may become stronger. These contractions are sometimes difficult to tell apart from true labor because they can be very uncomfortable. You should not feel embarrassed if you go to the hospital with false labor. Sometimes, the only way to tell if you are in true labor is for your health care provider to look for changes in the cervix. The health care provider   will do a physical exam and may  monitor your contractions. If you are not in true labor, the exam should show that your cervix is not dilating and your water has not broken. If there are no other health problems associated with your pregnancy, it is completely safe for you to be sent home with false labor. You may continue to have Braxton Hicks contractions until you go into true labor. How to tell the difference between true labor and false labor True labor  Contractions last 30-70 seconds.  Contractions become very regular.  Discomfort is usually felt in the top of the uterus, and it spreads to the lower abdomen and low back.  Contractions do not go away with walking.  Contractions usually become more intense and increase in frequency.  The cervix dilates and gets thinner. False labor  Contractions are usually shorter and not as strong as true labor contractions.  Contractions are usually irregular.  Contractions are often felt in the front of the lower abdomen and in the groin.  Contractions may go away when you walk around or change positions while lying down.  Contractions get weaker and are shorter-lasting as time goes on.  The cervix usually does not dilate or become thin. Follow these instructions at home:   Take over-the-counter and prescription medicines only as told by your health care provider.  Keep up with your usual exercises and follow other instructions from your health care provider.  Eat and drink lightly if you think you are going into labor.  If Braxton Hicks contractions are making you uncomfortable: ? Change your position from lying down or resting to walking, or change from walking to resting. ? Sit and rest in a tub of warm water. ? Drink enough fluid to keep your urine pale yellow. Dehydration may cause these contractions. ? Do slow and deep breathing several times an hour.  Keep all follow-up prenatal visits as told by your health care provider. This is important. Contact a  health care provider if:  You have a fever.  You have continuous pain in your abdomen. Get help right away if:  Your contractions become stronger, more regular, and closer together.  You have fluid leaking or gushing from your vagina.  You pass blood-tinged mucus (bloody show).  You have bleeding from your vagina.  You have low back pain that you never had before.  You feel your baby's head pushing down and causing pelvic pressure.  Your baby is not moving inside you as much as it used to. Summary  Contractions that occur before labor are called Braxton Hicks contractions, false labor, or practice contractions.  Braxton Hicks contractions are usually shorter, weaker, farther apart, and less regular than true labor contractions. True labor contractions usually become progressively stronger and regular, and they become more frequent.  Manage discomfort from Braxton Hicks contractions by changing position, resting in a warm bath, drinking plenty of water, or practicing deep breathing. This information is not intended to replace advice given to you by your health care provider. Make sure you discuss any questions you have with your health care provider. Document Revised: 05/26/2017 Document Reviewed: 10/27/2016 Elsevier Patient Education  2020 Elsevier Inc.  

## 2019-12-03 NOTE — Progress Notes (Signed)
Pt uses paper logs for Glucose

## 2019-12-03 NOTE — Progress Notes (Signed)
   PRENATAL VISIT NOTE  Subjective:  Abigail Knight is a 34 y.o. F6E3329 at [redacted]w[redacted]d being seen today for ongoing prenatal care.  She is currently monitored for the following issues for this high-risk pregnancy and has Normal pregnancy in multigravida in third trimester; Supervision of high risk pregnancy, antepartum; GDM (gestational diabetes mellitus); History of postpartum hemorrhage; Positive TB test; History of gestational diabetes mellitus (GDM) in prior pregnancy, currently pregnant; and Consanguinity on their problem list.  Patient reports no complaints.  Contractions: Not present. Vag. Bleeding: None.  Movement: Present. Denies leaking of fluid.   The following portions of the patient's history were reviewed and updated as appropriate: allergies, current medications, past family history, past medical history, past social history, past surgical history and problem list.   Objective:   Vitals:   12/03/19 1127  BP: 109/69  Pulse: (!) 103  Weight: 173 lb 9.6 oz (78.7 kg)    Fetal Status: Fetal Heart Rate (bpm): 144 Fundal Height: 40 cm Movement: Present     General:  Alert, oriented and cooperative. Patient is in no acute distress.  Skin: Skin is warm and dry. No rash noted.   Cardiovascular: Normal heart rate noted  Respiratory: Normal respiratory effort, no problems with respiration noted  Abdomen: Soft, gravid, appropriate for gestational age.  Pain/Pressure: Present     Pelvic: Cervical exam performed in the presence of a chaperone Dilation: Closed Effacement (%): 50 Station: Ballotable  Extremities: Normal range of motion.  Edema: None  Mental Status: Normal mood and affect. Normal behavior. Normal judgment and thought content.   Assessment and Plan:  Pregnancy: [id number] at [redacted]w[redacted]d 1. Supervision of high risk pregnancy, antepartum - GC/Chlamydia probe amp ([place])not at [facility] - Culture, beta strep (group b only)  2. Consanguinity  3. Gestational diabetes mellitus (GDM) in  third trimester, gestational diabetes method of control unspecified - 14/19 reports CBGs are elevated up to 155 at worst, fasting values 101-115 - Discussed with Dr. Jolayne Panther, start Metformin, Rx sent  - Patient now meets criteria for testing, [place] and BPP ordered as noted below  - [place] MFM OB DETAIL +14 WK; Future - [place] MFM FETAL BPP WO NON STRESS; Future - metFORMIN (GLUCOPHAGE) 1000 MG tablet; Take 1 tablet (1,000 mg total) by mouth 2 (two) times daily with a meal.  Dispense: 60 tablet; Refill: 1 - Plan for IOL 39-39.6 GA  Preterm labor symptoms and general obstetric precautions including but not limited to vaginal bleeding, contractions, leaking of fluid and fetal movement were reviewed in detail with the patient. Please refer to After Visit Summary for other counseling recommendations.   Return in about 1 week (around 12/10/2019) for [facility] MD only, In-Person.  Future Appointments  Date Time Provider Department Center  12/10/2019 10:35 AM Marlowe Alt, DO [facility]  12/13/2019  1:00 PM [facility] NURSE [facility]  12/13/2019  1:00 PM [facility] US1 [facility]    Vonzella Nipple, PA-C

## 2019-12-04 ENCOUNTER — Other Ambulatory Visit: Payer: Self-pay | Admitting: *Deleted

## 2019-12-04 DIAGNOSIS — O24419 Gestational diabetes mellitus in pregnancy, unspecified control: Secondary | ICD-10-CM

## 2019-12-04 LAB — GC/CHLAMYDIA PROBE AMP (~~LOC~~) NOT AT ARMC
Chlamydia: NEGATIVE
Comment: NEGATIVE
Comment: NORMAL
Neisseria Gonorrhea: NEGATIVE

## 2019-12-06 ENCOUNTER — Encounter: Payer: Self-pay | Admitting: Obstetrics and Gynecology

## 2019-12-06 DIAGNOSIS — B951 Streptococcus, group B, as the cause of diseases classified elsewhere: Secondary | ICD-10-CM | POA: Insufficient documentation

## 2019-12-06 LAB — CULTURE, BETA STREP (GROUP B ONLY): Strep Gp B Culture: POSITIVE — AB

## 2019-12-10 ENCOUNTER — Other Ambulatory Visit: Payer: Self-pay

## 2019-12-10 ENCOUNTER — Ambulatory Visit (INDEPENDENT_AMBULATORY_CARE_PROVIDER_SITE_OTHER): Payer: Medicaid Other | Admitting: Family Medicine

## 2019-12-10 ENCOUNTER — Encounter: Payer: Self-pay | Admitting: Family Medicine

## 2019-12-10 ENCOUNTER — Other Ambulatory Visit: Payer: Self-pay | Admitting: Family Medicine

## 2019-12-10 VITALS — BP 117/70 | HR 105 | Wt 174.4 lb

## 2019-12-10 DIAGNOSIS — R7611 Nonspecific reaction to tuberculin skin test without active tuberculosis: Secondary | ICD-10-CM | POA: Diagnosis not present

## 2019-12-10 DIAGNOSIS — O99820 Streptococcus B carrier state complicating pregnancy: Secondary | ICD-10-CM | POA: Diagnosis not present

## 2019-12-10 DIAGNOSIS — O0990 Supervision of high risk pregnancy, unspecified, unspecified trimester: Secondary | ICD-10-CM

## 2019-12-10 DIAGNOSIS — O24415 Gestational diabetes mellitus in pregnancy, controlled by oral hypoglycemic drugs: Secondary | ICD-10-CM | POA: Diagnosis not present

## 2019-12-10 DIAGNOSIS — Z3A37 37 weeks gestation of pregnancy: Secondary | ICD-10-CM

## 2019-12-10 DIAGNOSIS — O0993 Supervision of high risk pregnancy, unspecified, third trimester: Secondary | ICD-10-CM

## 2019-12-10 DIAGNOSIS — Z843 Family history of consanguinity: Secondary | ICD-10-CM

## 2019-12-10 DIAGNOSIS — B951 Streptococcus, group B, as the cause of diseases classified elsewhere: Secondary | ICD-10-CM

## 2019-12-10 DIAGNOSIS — O24419 Gestational diabetes mellitus in pregnancy, unspecified control: Secondary | ICD-10-CM

## 2019-12-10 DIAGNOSIS — Z8759 Personal history of other complications of pregnancy, childbirth and the puerperium: Secondary | ICD-10-CM

## 2019-12-10 NOTE — Patient Instructions (Signed)

## 2019-12-10 NOTE — Progress Notes (Signed)
Induction Assessment Scheduling Form: Fax to [facility]:  [phone] Route to [facility] Labor Delivery   Abigail Knight                                                                                   DOB:  [birth year]                                                            MRN:  [id number]  Phone:  Home Phone [phone]  Mobile [phone]    Provider:  [facility] (Faculty Practice)  Admission Date/Time:  12/20/19 (midnight or morning ok) GP:  [id number]     Gestational age on admission:  39.0                                                Estimated Date of Delivery: 12/27/19  Dating Criteria: LMP  Filed Weights   12/10/19 1125  Weight: 174 lb 6.4 oz (79.1 kg)    GBS: Positive/-- (06/08 1145) HIV:  Non-reactive (02/01 0000)  Reason for induction:  A2GDM Scheduling Provider Signature:  Marlowe Alt, DO         Method of induction(proposed):  cytotec   Scheduling Provider Signature:  Marlowe Alt, DO                                            Today's Date:  12/10/2019

## 2019-12-10 NOTE — Progress Notes (Signed)
Induction orders placed  Marlowe Alt, DO OB Fellow, Faculty Practice 12/10/2019 1:11 PM

## 2019-12-10 NOTE — Progress Notes (Signed)
Subjective:  Abigail Knight is a 34 y.o. [id number] at [redacted]w[redacted]d being seen today for ongoing prenatal care.  She is currently monitored for the following issues for this high-risk pregnancy and has Normal pregnancy in multigravida in third trimester; Supervision of high risk pregnancy, antepartum; GDM (gestational diabetes mellitus); History of postpartum hemorrhage; Positive TB test; Consanguinity; and Group beta Strep positive on their problem list.  Patient reports no complaints.  Contractions: Not present. Vag. Bleeding: None.  Movement: Present. Denies leaking of fluid.   The following portions of the patient's history were reviewed and updated as appropriate: allergies, current medications, past family history, past medical history, past social history, past surgical history and problem list. Problem list updated.  Objective:   Vitals:   12/10/19 1125  BP: 117/70  Pulse: (!) 105  Weight: 174 lb 6.4 oz (79.1 kg)    Fetal Status: Fetal Heart Rate (bpm): 140 Fundal Height: 41 cm Movement: Present     General:  Alert, oriented and cooperative. Patient is in no acute distress.  Skin: Skin is warm and dry. No rash noted.   Cardiovascular: Normal heart rate noted  Respiratory: Normal respiratory effort, no problems with respiration noted  Abdomen: Soft, gravid, appropriate for gestational age. Pain/Pressure: Present     Pelvic: Vag. Bleeding: None     Cervical exam deferred        Extremities: Normal range of motion.  Edema: None  Mental Status: Normal mood and affect. Normal behavior. Normal judgment and thought content.    Assessment and Plan:  Pregnancy: L2G4010 at [redacted]w[redacted]d  1. Supervision of high risk pregnancy, antepartum - Continue routine prenatal care - IOL scheduled for 39 weeks  2. History of postpartum hemorrhage - 2/2 retained placenta, s/p D&C + transfusion in [place]  3. Gestational diabetes mellitus (GDM) in third trimester, gestational diabetes method of control  unspecified - Blood sugars better with Metformin - Fastings now <95, only 2 post prandials >120 - IOL at 39 weeks - Pelvis proven to 4000 g - Growth [place] with BPP on 6/18  4. 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 Group beta Strep positive - will treat with antibiotics in labor  Term labor symptoms and general obstetric precautions including but not limited to vaginal bleeding, contractions, leaking of fluid and fetal movement were reviewed in detail with the patient. Please refer to After Visit Summary for other counseling recommendations.  Return for 1 week, in person, HOB.   Vance Belcourt L, DO

## 2019-12-11 ENCOUNTER — Telehealth (HOSPITAL_COMMUNITY): Payer: Self-pay | Admitting: *Deleted

## 2019-12-11 ENCOUNTER — Encounter: Payer: Self-pay | Admitting: *Deleted

## 2019-12-11 NOTE — Telephone Encounter (Signed)
Preadmission screen  

## 2019-12-13 ENCOUNTER — Encounter (HOSPITAL_COMMUNITY): Payer: Self-pay | Admitting: *Deleted

## 2019-12-13 ENCOUNTER — Other Ambulatory Visit: Payer: Self-pay | Admitting: Advanced Practice Midwife

## 2019-12-13 ENCOUNTER — Encounter (HOSPITAL_COMMUNITY): Payer: Self-pay | Admitting: Obstetrics & Gynecology

## 2019-12-13 ENCOUNTER — Other Ambulatory Visit: Payer: Self-pay | Admitting: Medical

## 2019-12-13 ENCOUNTER — Other Ambulatory Visit: Payer: Self-pay

## 2019-12-13 ENCOUNTER — Ambulatory Visit: Payer: Medicaid Other | Admitting: *Deleted

## 2019-12-13 ENCOUNTER — Inpatient Hospital Stay (HOSPITAL_COMMUNITY)
Admission: RE | Admit: 2019-12-13 | Discharge: 2019-12-16 | DRG: 805 | Disposition: A | Discharge status: Home / Self Care | Payer: Medicaid Other | Attending: Obstetrics and Gynecology | Admitting: Obstetrics and Gynecology

## 2019-12-13 ENCOUNTER — Ambulatory Visit (HOSPITAL_BASED_OUTPATIENT_CLINIC_OR_DEPARTMENT_OTHER): Payer: Medicaid Other

## 2019-12-13 VITALS — BP 110/69 | HR 102

## 2019-12-13 DIAGNOSIS — O24415 Gestational diabetes mellitus in pregnancy, controlled by oral hypoglycemic drugs: Secondary | ICD-10-CM

## 2019-12-13 DIAGNOSIS — O99824 Streptococcus B carrier state complicating childbirth: Secondary | ICD-10-CM | POA: Diagnosis present

## 2019-12-13 DIAGNOSIS — O403XX0 Polyhydramnios, third trimester, not applicable or unspecified: Principal | ICD-10-CM | POA: Diagnosis present

## 2019-12-13 DIAGNOSIS — O4593 Premature separation of placenta, unspecified, third trimester: Secondary | ICD-10-CM | POA: Diagnosis present

## 2019-12-13 DIAGNOSIS — Z30430 Encounter for insertion of intrauterine contraceptive device: Secondary | ICD-10-CM

## 2019-12-13 DIAGNOSIS — Z843 Family history of consanguinity: Secondary | ICD-10-CM | POA: Diagnosis not present

## 2019-12-13 DIAGNOSIS — O99892 Other specified diseases and conditions complicating childbirth: Secondary | ICD-10-CM | POA: Diagnosis present

## 2019-12-13 DIAGNOSIS — O24419 Gestational diabetes mellitus in pregnancy, unspecified control: Secondary | ICD-10-CM

## 2019-12-13 DIAGNOSIS — O4590 Premature separation of placenta, unspecified, unspecified trimester: Secondary | ICD-10-CM

## 2019-12-13 DIAGNOSIS — B951 Streptococcus, group B, as the cause of diseases classified elsewhere: Secondary | ICD-10-CM | POA: Diagnosis present

## 2019-12-13 DIAGNOSIS — Z3A38 38 weeks gestation of pregnancy: Secondary | ICD-10-CM

## 2019-12-13 DIAGNOSIS — N750 Cyst of Bartholin's gland: Secondary | ICD-10-CM | POA: Diagnosis present

## 2019-12-13 DIAGNOSIS — Z3490 Encounter for supervision of normal pregnancy, unspecified, unspecified trimester: Secondary | ICD-10-CM | POA: Diagnosis present

## 2019-12-13 DIAGNOSIS — Z3A39 39 weeks gestation of pregnancy: Secondary | ICD-10-CM | POA: Diagnosis not present

## 2019-12-13 DIAGNOSIS — Z363 Encounter for antenatal screening for malformations: Secondary | ICD-10-CM

## 2019-12-13 DIAGNOSIS — Z20822 Contact with and (suspected) exposure to covid-19: Secondary | ICD-10-CM | POA: Diagnosis present

## 2019-12-13 DIAGNOSIS — O24420 Gestational diabetes mellitus in childbirth, diet controlled: Secondary | ICD-10-CM | POA: Diagnosis not present

## 2019-12-13 LAB — SARS CORONAVIRUS 2 BY RT PCR (HOSPITAL ORDER, PERFORMED IN ~~LOC~~ HOSPITAL LAB): SARS Coronavirus 2: NEGATIVE

## 2019-12-13 LAB — CBC
HCT: 40.8 % (ref 36.0–46.0)
Hemoglobin: 13.3 g/dL (ref 12.0–15.0)
MCH: 31.4 pg (ref 26.0–34.0)
MCHC: 32.6 g/dL (ref 30.0–36.0)
MCV: 96.2 fL (ref 80.0–100.0)
Platelets: 186 10*3/uL (ref 150–400)
RBC: 4.24 MIL/uL (ref 3.87–5.11)
RDW: 15.3 % (ref 11.5–15.5)
WBC: 7.9 10*3/uL (ref 4.0–10.5)
nRBC: 0 % (ref 0.0–0.2)

## 2019-12-13 LAB — GLUCOSE, CAPILLARY: Glucose-Capillary: 125 mg/dL — ABNORMAL HIGH (ref 70–99)

## 2019-12-13 LAB — TYPE AND SCREEN
ABO/RH(D): O POS
Antibody Screen: NEGATIVE

## 2019-12-13 LAB — ABO/RH: ABO/RH(D): O POS

## 2019-12-13 MED ORDER — SODIUM CHLORIDE 0.9 % IV SOLN
5.0000 10*6.[IU] | Freq: Once | INTRAVENOUS | Status: AC
  Administered 2019-12-13: 5 10*6.[IU] via INTRAVENOUS
  Filled 2019-12-13: qty 5

## 2019-12-13 MED ORDER — FENTANYL CITRATE (PF) 100 MCG/2ML IJ SOLN
50.0000 ug | INTRAMUSCULAR | Status: DC | PRN
  Administered 2019-12-14: 100 ug via INTRAVENOUS
  Filled 2019-12-13: qty 2

## 2019-12-13 MED ORDER — LIDOCAINE HCL (PF) 1 % IJ SOLN: 30.0000 mL | INTRAMUSCULAR | Status: DC | PRN

## 2019-12-13 MED ORDER — ONDANSETRON HCL 4 MG/2ML IJ SOLN
4.0000 mg | Freq: Four times a day (QID) | INTRAMUSCULAR | Status: DC | PRN
  Administered 2019-12-14: 4 mg via INTRAVENOUS
  Filled 2019-12-13: qty 2

## 2019-12-13 MED ORDER — MISOPROSTOL 100 MCG PO TABS
25.0000 ug | ORAL_TABLET | ORAL | Status: DC
  Administered 2019-12-13 – 2019-12-14 (×2): 25 ug via VAGINAL
  Filled 2019-12-13 (×2): qty 1

## 2019-12-13 MED ORDER — OXYTOCIN BOLUS FROM INFUSION
333.0000 mL | Freq: Once | INTRAVENOUS | Status: AC
  Administered 2019-12-14: 333 mL via INTRAVENOUS

## 2019-12-13 MED ORDER — PENICILLIN G POT IN DEXTROSE 60000 UNIT/ML IV SOLN
3.0000 10*6.[IU] | INTRAVENOUS | Status: DC
  Administered 2019-12-14 (×4): 3 10*6.[IU] via INTRAVENOUS
  Filled 2019-12-13 (×7): qty 50

## 2019-12-13 MED ORDER — OXYTOCIN-SODIUM CHLORIDE 30-0.9 UT/500ML-% IV SOLN
2.5000 [IU]/h | INTRAVENOUS | Status: DC
  Administered 2019-12-14: 2.5 [IU]/h via INTRAVENOUS
  Filled 2019-12-13 (×2): qty 500

## 2019-12-13 MED ORDER — TERBUTALINE SULFATE 1 MG/ML IJ SOLN: 0.2500 mg | Freq: Once | INTRAMUSCULAR | Status: DC | PRN

## 2019-12-13 MED ORDER — METFORMIN HCL 500 MG PO TABS
1000.0000 mg | ORAL_TABLET | Freq: Two times a day (BID) | ORAL | Status: DC
  Administered 2019-12-13: 1000 mg via ORAL
  Filled 2019-12-13 (×3): qty 2

## 2019-12-13 MED ORDER — LACTATED RINGERS IV SOLN
500.0000 mL | INTRAVENOUS | Status: DC | PRN
  Administered 2019-12-14: 500 mL via INTRAVENOUS

## 2019-12-13 MED ORDER — SOD CITRATE-CITRIC ACID 500-334 MG/5ML PO SOLN: 30.0000 mL | ORAL | Status: DC | PRN

## 2019-12-13 MED ORDER — ACETAMINOPHEN 325 MG PO TABS: 650.0000 mg | ORAL_TABLET | ORAL | Status: DC | PRN

## 2019-12-13 MED ORDER — LEVONORGESTREL 19.5 MCG/DAY IU IUD
INTRAUTERINE_SYSTEM | Freq: Once | INTRAUTERINE | Status: AC
  Filled 2019-12-13: qty 1

## 2019-12-13 MED ORDER — LACTATED RINGERS IV SOLN: INTRAVENOUS | Status: DC

## 2019-12-13 NOTE — Procedures (Signed)
Abigail Knight 1985-12-15 [redacted]w[redacted]d  Fetus A Non-Stress Test Interpretation for 12/13/19  Indication: Unsatisfactory BPP  Fetal Heart Rate A Mode: External Baseline Rate (A): 120 bpm Variability: Minimal, Moderate Accelerations: 10 x 10 Decelerations: None  Uterine Activity Mode: Toco Contraction Frequency (min): irreg UC noted  Interpretation (Fetal Testing) Nonstress Test Interpretation: Non-reactive Comments: FHR tracing rev'd by Dr. Judeth Cornfield, Pt to go to [facility] for admission to labor and delivery

## 2019-12-13 NOTE — H&P (Signed)
OBSTETRIC ADMISSION HISTORY AND PHYSICAL  Abigail Knight is a 34 y.o. female [phone] with IUP at [redacted]w[redacted]d presenting for IOL 2/2 BPP 6/10, A2GDM with polyhydramnios. She reports +FMs. No LOF, VB, blurry vision, headaches, peripheral edema, or RUQ pain. She plans on breastfeeding. She requests IUD for birth control.  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 Blood transfusion without reported diagnosis    PPH  . Gestational diabetes   . History of postpartum hemorrhage, currently pregnant   . Medical history non-contributory   . Normal pregnancy in multigravida in third trimester 11/11/2017  . SVD (spontaneous vaginal delivery) 11/11/2017  . Vaginal cyst     Past Surgical History: Past Surgical History:  Procedure Laterality Date  . DILATION AND CURETTAGE OF UTERUS  2017   retained placenta    Obstetrical History: OB History    Gravida  4   Para  2   Term  2   Preterm      AB  1   Living  2     SAB  1   TAB      Ectopic      Multiple  0   Live Births  2           Social History: Social History   Socioeconomic History  . Marital status: Married    Spouse name: Not on file  . Number of children: Not on file  . Years of education: Not on file  . Highest education level: Not on file  Occupational History  . Not on file  Tobacco Use  . Smoking status: Never Smoker  . Smokeless tobacco: Never Used  Substance and Sexual Activity  . Alcohol use: No  . Drug use: No  . Sexual activity: Not on file  Other Topics  Concern  . Not on file  Social History Narrative  . Not on file   Social Determinants of Health   Financial Resource Strain:   . Difficulty of Paying Living Expenses:   Food Insecurity: No Food Insecurity  . Worried About 2018 in the Last Year: Never true  . Ran Out of Food in the Last Year: Never true  Transportation Needs: No Transportation Needs  . Lack of Transportation (Medical): No  . Lack of Transportation (Non-Medical): No  Physical Activity:   . Days of Exercise per Week:   . Minutes of Exercise per Session:   Stress:   . Feeling of Stress :   Social Connections:   . Frequency of Communication with Friends and Family:   . Frequency of Social Gatherings with Friends and Family:   . Attends Religious Services:   . Active Member of Clubs or Organizations:   . Attends Programme researcher, broadcasting/film/video Meetings:   Banker Marital Status:     Family History: No family history on file.  Allergies: No Known Allergies  Medications Prior to Admission  Medication Sig Dispense Refill Last Dose  . acetaminophen (TYLENOL) 325 MG tablet Take 325 mg by mouth every 6 (six) hours as needed for moderate pain.   Past Week at  Unknown time  . metFORMIN (GLUCOPHAGE) 1000 MG tablet Take 1 tablet (1,000 mg total) by mouth 2 (two) times daily with a meal. 60 tablet 1 12/13/2019 at Unknown time  . Prenatal Vit-Fe Fumarate-FA (PRENATAL VITAMINS) 28-0.8 MG TABS Take 1 tablet by mouth daily. 100 tablet 3 12/13/2019 at Unknown time  . Accu-Chek Softclix Lancets lancets Use as instructed; patient to check 4 times daily. 100 each 12   . glucose blood (ACCU-CHEK GUIDE) test strip Use as instructed; patient to check 4 times daily. 100 each 12      Review of Systems:  All systems reviewed and negative except as stated in HPI  PE: Blood pressure 112/74, pulse 100, temperature 98.4 F (36.9 C), resp. rate 18, last menstrual period 03/22/2019, unknown if currently breastfeeding. General appearance:  alert Lungs: regular rate and effort Heart: regular rate  Abdomen: soft, non-tender Extremities: Homans sign is negative, no sign of DVT Presentation: cephalic EFM: 120 bpm, moerate variability, 15x15 accels, no decels Toco: Occasional contraction Dilation: Fingertip Effacement (%): Thick Station: -3 Exam by:: Dr. Salomon Mast SVE: 4x5 cm right Bartholin cyst visible at the introitus  Prenatal labs: ABO, Rh: --/--/O POS, O POS Performed at [facility], [address], [place], [place] [address]  [phone] 1726) Antibody: NEG (06/18 1726) Rubella: Immune (02/01 0000) RPR: Nonreactive (04/12 0000)  HBsAg: Negative (02/01 0000)  HIV: Non-reactive (02/01 0000)  GBS: Positive/-- (06/08 1145)  2 hr GTT 79/216/203/134  Prenatal Transfer Tool  Maternal Diabetes: Yes:  Diabetes Type:  Insulin/Medication controlled Genetic Screening: Normal Maternal Ultrasounds/Referrals: Other: Macrosomia Fetal Ultrasounds or other Referrals:  Referred to Materal Fetal Medicine  Maternal Substance Abuse:  No Significant Maternal Medications:  Meds include: Other:  Metformin Significant Maternal Lab Results: Group B Strep positive  Results for orders placed or performed during the hospital encounter of 12/13/19 (from the past 24 hour(s))  CBC   Collection Time: 12/13/19  5:26 PM  Result Value Ref Range   WBC 7.9 4.0 - 10.5 K/uL   RBC 4.24 3.87 - 5.11 MIL/uL   Hemoglobin 13.3 12.0 - 15.0 g/dL   HCT 37.8 36 - 46 %   MCV 96.2 80.0 - 100.0 fL   MCH 31.4 26.0 - 34.0 pg   MCHC 32.6 30.0 - 36.0 g/dL   RDW 58.8 50.2 - 77.4 %   Platelets 186 150 - 400 K/uL   nRBC 0.0 0.0 - 0.2 %  Type and screen   Collection Time: 12/13/19  5:26 PM  Result Value Ref Range   ABO/RH(D) O POS    Antibody Screen NEG    Sample Expiration      12/16/2019,2359 Performed at [facility], [address] [place], [place] [address]   ABO/Rh   Collection Time: 12/13/19  5:26 PM  Result Value Ref Range    ABO/RH(D)      O POS Performed at [facility], [address], [place], [place] [address]     Patient Active Problem List   Diagnosis Date Noted  . Encounter for induction of labor 12/13/2019  . Group beta Strep positive 12/06/2019  . Consanguinity 12/03/2019  . Supervision of high risk pregnancy, antepartum 10/14/2019  . GDM (gestational diabetes mellitus) 10/14/2019  . Positive TB test 10/14/2019  . Normal pregnancy in multigravida in third trimester 11/11/2017  . History of postpartum hemorrhage 08/21/2017    Assessment: Amoni Scallan is a 34 y.o. [id number] at [redacted]w[redacted]d here for IOL 2/2  1. Labor: Due to EFW being 4453g, the following were discussed in detail- A)  Shoulder precautions discussed in detail, including but not limited to: need for additional procedures, additional providers in the room, attendance of NICU staff, potential need for emergent Cesarean delivery. B) Risks and benefits of induction were reviewed, including failure of method, prolonged labor, need for further intervention, risk of cesarean.  Patient and family seem to understand these risks and wish to proceed. Options of cytotec, foley bulb, AROM, and pitocin reviewed, with use of each discussed. C) The risks of cesarean section were discussed with the patient including but were not limited to: bleeding which may require transfusion or reoperation; infection which may require antibiotics; injury to bowel, bladder, ureters or other surrounding organs; injury to the fetus; need for additional procedures including hysterectomy in the event of a life-threatening hemorrhage; placental abnormalities wth subsequent pregnancies, incisional problems, thromboembolic phenomenon and other postoperative/anesthesia complications.    After much discussion, the patient and her husband opt to proceed with induction of labor. We plan to have the attending and NICU staff present for delivery. The patient and her husband are in  agreement with this plan. Will start with cytotec, consider FB as appropriate.  2. FWB: BPP was 6/10 at MFM today (nonreastive NST, breathing). In MAU, FHT is Cat I, EFW 4453g. Pelvis proven to 4082g  3. Pain: Per patient request  4. GBS: positive, PCN  5. Right Bartholin's Cyst: Discussed with Dr. Glo Herring. Will I&D once patient has an epidural placed  6. A2GDM: CBG q4h, q2h when active. Consider insulin if needed.   Plan: Admit to L&D  Merilyn Baba, DO  12/13/2019, 6:59 PM

## 2019-12-13 NOTE — Telephone Encounter (Signed)
Interpreter number [phone]

## 2019-12-13 NOTE — MAU Note (Signed)
Pt sent from MFM for induction due to Polyhydramnios and non reactive NST.  Pt denies any pain or cramping at this time. Good fetal movement reported

## 2019-12-14 ENCOUNTER — Encounter (HOSPITAL_COMMUNITY): Payer: Self-pay | Admitting: Obstetrics & Gynecology

## 2019-12-14 ENCOUNTER — Inpatient Hospital Stay (HOSPITAL_COMMUNITY): Payer: Medicaid Other | Admitting: Anesthesiology

## 2019-12-14 DIAGNOSIS — Z30430 Encounter for insertion of intrauterine contraceptive device: Secondary | ICD-10-CM

## 2019-12-14 DIAGNOSIS — Z3A39 39 weeks gestation of pregnancy: Secondary | ICD-10-CM

## 2019-12-14 DIAGNOSIS — N750 Cyst of Bartholin's gland: Secondary | ICD-10-CM

## 2019-12-14 DIAGNOSIS — O99824 Streptococcus B carrier state complicating childbirth: Secondary | ICD-10-CM

## 2019-12-14 DIAGNOSIS — O24420 Gestational diabetes mellitus in childbirth, diet controlled: Secondary | ICD-10-CM

## 2019-12-14 DIAGNOSIS — O4590 Premature separation of placenta, unspecified, unspecified trimester: Secondary | ICD-10-CM

## 2019-12-14 LAB — GLUCOSE, CAPILLARY
Glucose-Capillary: 76 mg/dL (ref 70–99)
Glucose-Capillary: 81 mg/dL (ref 70–99)
Glucose-Capillary: 99 mg/dL (ref 70–99)
Glucose-Capillary: 90 mg/dL (ref 70–99)
Glucose-Capillary: 99 mg/dL (ref 70–99)

## 2019-12-14 LAB — RPR: RPR Ser Ql: NONREACTIVE

## 2019-12-14 MED ORDER — FENTANYL-BUPIVACAINE-NACL 0.5-0.125-0.9 MG/250ML-% EP SOLN: 12.0000 mL/h | EPIDURAL | Status: DC | PRN

## 2019-12-14 MED ORDER — BENZOCAINE-MENTHOL 20-0.5 % EX AERO: 1.0000 "application " | INHALATION_SPRAY | CUTANEOUS | Status: DC | PRN

## 2019-12-14 MED ORDER — PRENATAL MULTIVITAMIN CH: 1.0000 | ORAL_TABLET | Freq: Every day | ORAL | Status: DC

## 2019-12-14 MED ORDER — TRANEXAMIC ACID-NACL 1000-0.7 MG/100ML-% IV SOLN
INTRAVENOUS | Status: AC
  Filled 2019-12-14: qty 100

## 2019-12-14 MED ORDER — TERBUTALINE SULFATE 1 MG/ML IJ SOLN: 0.2500 mg | Freq: Once | INTRAMUSCULAR | Status: DC | PRN

## 2019-12-14 MED ORDER — COCONUT OIL OIL: 1.0000 "application " | TOPICAL_OIL | Status: DC | PRN

## 2019-12-14 MED ORDER — IBUPROFEN 600 MG PO TABS
600.0000 mg | ORAL_TABLET | Freq: Three times a day (TID) | ORAL | Status: DC | PRN
  Administered 2019-12-15 – 2019-12-16 (×3): 600 mg via ORAL
  Filled 2019-12-14 (×3): qty 1

## 2019-12-14 MED ORDER — PHENYLEPHRINE 40 MCG/ML (10ML) SYRINGE FOR IV PUSH (FOR BLOOD PRESSURE SUPPORT): 80.0000 ug | PREFILLED_SYRINGE | INTRAVENOUS | Status: DC | PRN

## 2019-12-14 MED ORDER — LACTATED RINGERS IV SOLN: 500.0000 mL | Freq: Once | INTRAVENOUS | Status: DC

## 2019-12-14 MED ORDER — FENTANYL-BUPIVACAINE-NACL 0.5-0.125-0.9 MG/250ML-% EP SOLN
EPIDURAL | Status: AC
  Filled 2019-12-14: qty 250

## 2019-12-14 MED ORDER — ONDANSETRON HCL 4 MG PO TABS: 4.0000 mg | ORAL_TABLET | ORAL | Status: DC | PRN

## 2019-12-14 MED ORDER — SIMETHICONE 80 MG PO CHEW: 80.0000 mg | CHEWABLE_TABLET | ORAL | Status: DC | PRN

## 2019-12-14 MED ORDER — ONDANSETRON HCL 4 MG/2ML IJ SOLN: 4.0000 mg | INTRAMUSCULAR | Status: DC | PRN

## 2019-12-14 MED ORDER — LIDOCAINE HCL (PF) 1 % IJ SOLN
INTRAMUSCULAR | Status: DC | PRN
  Administered 2019-12-14: 5 mL via EPIDURAL

## 2019-12-14 MED ORDER — METHYLERGONOVINE MALEATE 0.2 MG/ML IJ SOLN
INTRAMUSCULAR | Status: AC
  Filled 2019-12-14: qty 1

## 2019-12-14 MED ORDER — WITCH HAZEL-GLYCERIN EX PADS: 1.0000 "application " | MEDICATED_PAD | CUTANEOUS | Status: DC | PRN

## 2019-12-14 MED ORDER — TETANUS-DIPHTH-ACELL PERTUSSIS 5-2.5-18.5 LF-MCG/0.5 IM SUSP: 0.5000 mL | Freq: Once | INTRAMUSCULAR | Status: DC

## 2019-12-14 MED ORDER — EPHEDRINE 5 MG/ML INJ: 10.0000 mg | INTRAVENOUS | Status: DC | PRN

## 2019-12-14 MED ORDER — OXYTOCIN-SODIUM CHLORIDE 30-0.9 UT/500ML-% IV SOLN
1.0000 m[IU]/min | INTRAVENOUS | Status: DC
  Administered 2019-12-14: 2 m[IU]/min via INTRAVENOUS

## 2019-12-14 MED ORDER — SENNOSIDES-DOCUSATE SODIUM 8.6-50 MG PO TABS
2.0000 | ORAL_TABLET | ORAL | Status: DC
  Administered 2019-12-15 (×2): 2 via ORAL
  Filled 2019-12-14 (×2): qty 2

## 2019-12-14 MED ORDER — DIPHENHYDRAMINE HCL 50 MG/ML IJ SOLN: 12.5000 mg | INTRAMUSCULAR | Status: DC | PRN

## 2019-12-14 MED ORDER — SODIUM CHLORIDE (PF) 0.9 % IJ SOLN
INTRAMUSCULAR | Status: DC | PRN
  Administered 2019-12-14: 12 mL/h via EPIDURAL

## 2019-12-14 MED ORDER — ACETAMINOPHEN 325 MG PO TABS
650.0000 mg | ORAL_TABLET | Freq: Four times a day (QID) | ORAL | Status: DC | PRN
  Administered 2019-12-14: 650 mg via ORAL
  Filled 2019-12-14 (×3): qty 2

## 2019-12-14 MED ORDER — METHYLERGONOVINE MALEATE 0.2 MG/ML IJ SOLN
0.2000 mg | Freq: Once | INTRAMUSCULAR | Status: AC
  Administered 2019-12-14: 0.2 mg via INTRAMUSCULAR

## 2019-12-14 MED ORDER — LACTATED RINGERS IV SOLN
500.0000 mL | Freq: Once | INTRAVENOUS | Status: AC
  Administered 2019-12-14: 500 mL via INTRAVENOUS

## 2019-12-14 MED ORDER — TRANEXAMIC ACID-NACL 1000-0.7 MG/100ML-% IV SOLN
1000.0000 mg | INTRAVENOUS | Status: AC
  Administered 2019-12-14: 1000 mg via INTRAVENOUS

## 2019-12-14 MED ORDER — DIPHENHYDRAMINE HCL 25 MG PO CAPS: 25.0000 mg | ORAL_CAPSULE | Freq: Four times a day (QID) | ORAL | Status: DC | PRN

## 2019-12-14 MED ORDER — DIBUCAINE (PERIANAL) 1 % EX OINT: 1.0000 "application " | TOPICAL_OINTMENT | CUTANEOUS | Status: DC | PRN

## 2019-12-14 MED ORDER — PRENATAL MULTIVITAMIN CH: 1.0000 | ORAL_TABLET | Freq: Once | ORAL | Status: DC

## 2019-12-14 MED ORDER — MEASLES, MUMPS & RUBELLA VAC IJ SOLR: 0.5000 mL | Freq: Once | INTRAMUSCULAR | Status: DC

## 2019-12-14 MED ORDER — PHENYLEPHRINE 40 MCG/ML (10ML) SYRINGE FOR IV PUSH (FOR BLOOD PRESSURE SUPPORT)
80.0000 ug | PREFILLED_SYRINGE | INTRAVENOUS | Status: DC | PRN
  Filled 2019-12-14: qty 10

## 2019-12-14 NOTE — Anesthesia Preprocedure Evaluation (Signed)
Anesthesia Evaluation  Patient identified by MRN, date of birth, ID band Patient awake    Reviewed: Allergy & Precautions, Patient's Chart, lab work & pertinent test results  Airway Mallampati: II       Dental no notable dental hx.    Pulmonary    Pulmonary exam normal        Cardiovascular Normal cardiovascular exam     Neuro/Psych negative neurological ROS  negative psych ROS   GI/Hepatic negative GI ROS,   Endo/Other  diabetes, Type 2, Oral Hypoglycemic Agents  Renal/GU      Musculoskeletal   Abdominal   Peds  Hematology   Anesthesia Other Findings   Reproductive/Obstetrics (+) Pregnancy                            Anesthesia Physical Anesthesia Plan  ASA: II  Anesthesia Plan: Epidural   Post-op Pain Management:    Induction:   PONV Risk Score and Plan:   Airway Management Planned: Natural Airway  Additional Equipment: None  Intra-op Plan:   Post-operative Plan:   Informed Consent:   Plan Discussed with:   Anesthesia Plan Comments: (Lab Results      Component                Value               Date                      WBC                      7.9                 12/13/2019                HGB                      13.3                12/13/2019                HCT                      40.8                12/13/2019                MCV                      96.2                12/13/2019                PLT                      186                 12/13/2019             interpreter utilized. )       Anesthesia Quick Evaluation

## 2019-12-14 NOTE — Progress Notes (Signed)
Dr. Hart Rochester at bedside. Translator Nael [phone] used to discuss Epidural placement procedure and answer any questions. Patient reports understanding and has no further questions at this time.

## 2019-12-14 NOTE — Progress Notes (Signed)
Parent request formula to supplement breast feeding due to inability of baby to sustain latch and infant being very sleepy and low blood sugar. Parents educated using interpreter. Lactation consult ordered per parent request. Parents have been informed of small tummy size of newborn, taught hand expression and understand the possible consequences of formula to the health of the infant. The possible consequences shared with patient include 1) Loss of confidence in breastfeeding 2) Engorgement 3) Allergic sensitization of baby(asthma/allergies) and 4) decreased milk supply for mother. After discussion of the above the mother decided to suplement with formula. The tool used to give formula supplement will be curved tip syringe.

## 2019-12-14 NOTE — Discharge Summary (Signed)
Postpartum Discharge Summary  Date of Service updated yes     Patient Name: Abigail Knight DOB: 05/15/1986 MRN: [id number]  Date of admission: 12/13/2019 Delivery date:12/14/2019  Delivering provider: Lenna Sciara  Date of discharge: 12/16/2019  Admitting diagnosis: Encounter for induction of labor [Z34.90] Intrauterine pregnancy: [redacted]w[redacted]d    Secondary diagnosis:  Active Problems:   GDM (gestational diabetes mellitus)   Group beta Strep positive   Encounter for induction of labor   PPH (postpartum hemorrhage)   Placental abruption affecting delivery  Additional problems: None    Discharge diagnosis: Term Pregnancy Delivered, GDM A2 and PPH                                              Post partum procedures:None Augmentation: Pitocin, Cytotec and IP Foley Complications: Placental Abruption and Hemorrhage>10034m Hospital course: Induction of Labor With Vaginal Delivery   [age].o. yo [id number] 3811w1ds admitted to the hospital 12/13/2019 for induction of labor.  Indication for induction: A2 DM and BPP 6/8.  Patient had an uncomplicated labor course as follows: Received Foley bulb placed and received 2 doses of Cytotec for cervical ripening.  Started on pitocin.  Had SROM.  Progressed to complete without complication.  Given history of postpartum hemorrhage, she received TXA about 30 minutes prior to delivery. Membrane Rupture Time/Date: 6:36 AM ,12/14/2019   Delivery Method:Vaginal, Spontaneous  Episiotomy: None  Lacerations:  1st degree  Details of delivery can be found in separate delivery note. Immediately after delivery on infant, large amount of dark maroon clots delivered concerning for abruption; placenta sent to pathology. IUD placed after delivery. I&D of Bartholin cyst done after IUD placement and laceration repair. Fasting AM glucose 96. Repeat CBC done due to [place]0.1). Patient had a routine postpartum course. Patient is discharged home 12/16/19.  Newborn Data: Birth  date:12/14/2019  Birth time:12:44 PM  Gender:Female  Living status:Living  Apgars:3 ,9  Weight:4077 g   Magnesium Sulfate received: No BMZ received: No Rhophylac:N/A MMR:N/A T-DaP:Given prenatally Flu: No Transfusion:No  Physical exam  Vitals:   12/15/19 1030 12/15/19 1434 12/15/19 2324 12/16/19 0534  BP: 91/65 99/66 112/76 101/68  Pulse: 90 81 86 78  Resp: _0 Temp: 98.3 F (36.8 C) 98.2 F (36.8 C) 97.9 F (36.6 C) 98.1 F (36.7 C)  TempSrc:  Oral    SpO2: 99%   99%  Weight:      Height:       General: alert, cooperative and no distress Lochia: appropriate Uterine Fundus: firm Incision: Healing well with no significant drainage, No significant erythema, Dressing is clean, dry, and intact DVT Evaluation: No evidence of DVT seen on physical exam. Negative Homan's sign. No cords or calf tenderness. No significant calf/ankle edema. Labs: Lab Results  Component Value Date   WBC 17.8 (H) 12/15/2019   HGB 10.0 (L) 12/15/2019   HCT 30.4 (L) 12/15/2019   MCV 95.6 12/15/2019   PLT 158 12/15/2019   CMP Latest Ref Rng & Units 10/07/2019  Glucose 70 - 99 79   Edinburgh Score: Edinburgh Postnatal Depression Scale Screening Tool 12/15/2019  I have been able to laugh and see the funny side of things. (No Data)  I have looked forward with enjoyment to things. -  I have blamed myself unnecessarily when things went  wrong. -  I have been anxious or worried for no good reason. -  I have felt scared or panicky for no good reason. -  Things have been getting on top of me. -  I have been so unhappy that I have had difficulty sleeping. -  I have felt sad or miserable. -  I have been so unhappy that I have been crying. -  The thought of harming myself has occurred to me. Flavia Shipper Postnatal Depression Scale Total -     After visit meds:  Allergies as of 12/16/2019   No Known Allergies     Medication List    STOP taking these medications   Accu-Chek Guide test  strip Generic drug: glucose blood   Accu-Chek Softclix Lancets lancets   acetaminophen 325 MG tablet Commonly known as: TYLENOL   metFORMIN 1000 MG tablet Commonly known as: GLUCOPHAGE     TAKE these medications   ibuprofen 600 MG tablet Commonly known as: ADVIL Take 1 tablet (600 mg total) by mouth every 8 (eight) hours as needed for mild pain.   Prenatal Vitamins 28-0.8 MG Tabs Take 1 tablet by mouth daily.        Discharge home in stable condition Infant Feeding: Breast Infant Disposition:home with mother Discharge instruction: per After Visit Summary and Postpartum booklet. Activity: Advance as tolerated. Pelvic rest for 6 weeks.  Diet: routine diet Future Appointments: Future Appointments  Date Time Provider [place]  12/18/2019  9:10 AM [facility] None  01/17/2020  8:15 AM Danielle Rankin [facility]  01/17/2020  9:30 AM [facility] [place]   Follow up Visit:  [place] [facility]. Go in 6 week(s).   Specialty: Obstetrics and Gynecology Contact information: [place] [address] [phone]               Please schedule this patient for a In person postpartum visit in 4 weeks with the following provider: Any provider. Additional Postpartum F/U:2 hour GTT and IUD string check High risk pregnancy complicated by: GDM Delivery mode:  Vaginal, Spontaneous  Anticipated Birth Control:  IUD   12/16/2019 Gabriel Carina, CNM

## 2019-12-14 NOTE — Progress Notes (Addendum)
LABOR PROGRESS NOTE  Lalia Loudon is a 34 y.o. [id number] at [redacted]w[redacted]d  admitted for IOL 2/2 BPP 6/8 with hx of GDMA2, poly and bartholin cyst.  Subjective: Epidural placed recently, patient reports feeling much better. Discussed starting pitocin to which patient is agreeable.   Objective: BP 112/70   Pulse 97   Temp 97.9 F (36.6 C) (Oral)   Resp 16   Ht 5' 2.5" (1.588 m)   Wt 78.4 kg   LMP 03/22/2019   SpO2 100%   BMI 31.11 kg/m  or  Vitals:   12/14/19 0138 12/14/19 0330 12/14/19 0335 12/14/19 0340  BP: 119/74 112/64 106/64 112/70  Pulse: 90 92 (!) 101 97  Resp:      Temp:      TempSrc:      SpO2:  100% 100% 100%  Weight:      Height:       Dilation: 4.5 Effacement (%): 60 Cervical Position: Posterior Station: -3 Presentation: Vertex Exam by:: Dr. Sharol Harness FHT: baseline rate 120, moderate varibility, +acel, no decel Toco: every 4-5 minutes   Labs: Lab Results  Component Value Date   WBC 7.9 12/13/2019   HGB 13.3 12/13/2019   HCT 40.8 12/13/2019   MCV 96.2 12/13/2019   PLT 186 12/13/2019    Patient Active Problem List   Diagnosis Date Noted  . Encounter for induction of labor 12/13/2019  . Group beta Strep positive 12/06/2019  . Consanguinity 12/03/2019  . Supervision of high risk pregnancy, antepartum 10/14/2019  . GDM (gestational diabetes mellitus) 10/14/2019  . Positive TB test 10/14/2019  . Normal pregnancy in multigravida in third trimester 11/11/2017  . History of postpartum hemorrhage 08/21/2017    Assessment / Plan: 34 y.o. [id number] at [redacted]w[redacted]d here for IOL 2/2 to BPP 6/8 with hx of GDMA2, polyhydramnios and a bartholin cyst.  Labor: progressing,s/p FB and 2 doses cytotec , cervical change to 4.5/60/, pitocin to 2x2 at 0411 to be four hours after last cytotec  Fetal Wellbeing:  Category I  Pain Control:  IV fentanyl  GBS: positive, on PCN  Anticipated MOD:  Vaginal delivery   Nicki Gu[place], MD Family Medicine, PGY-1  12/14/2019, 3:52 AM

## 2019-12-14 NOTE — Progress Notes (Signed)
LABOR PROGRESS NOTE  Abigail Knight is a 34 y.o. [id number] at [redacted]w[redacted]d  admitted for IOL 2/2 BPP 6/8 with hx of GDMA2, poly and bartholin cyst.  Subjective: Patient resting comfortably with no new complaints. Discussed FB and patient agreeable with this plan.   Objective: BP 113/64    Pulse 93    Temp 97.9 F (36.6 C) (Oral)    Resp 16    Ht 5' 2.5" (1.588 m)    Wt 78.4 kg    LMP 03/22/2019    BMI 31.11 kg/m  or  Vitals:   12/13/19 1711 12/13/19 2038 12/14/19 0009 12/14/19 0011  BP: 112/74 135/78 113/64 113/64  Pulse: 100 97 93 93  Resp: 18   16  Temp: 98.4 F (36.9 C)   97.9 F (36.6 C)  TempSrc:    Oral  Weight:  78.4 kg    Height:  5' 2.5" (1.588 m)     Dilation: 1 Effacement (%): 40 Cervical Position: Posterior Station: Ballotable Presentation: Vertex Exam by:: Dr. Sharol Harness FHT: baseline rate 120, moderate varibility, +acel, no decel Toco: every 5 minutes   Labs: Lab Results  Component Value Date   WBC 7.9 12/13/2019   HGB 13.3 12/13/2019   HCT 40.8 12/13/2019   MCV 96.2 12/13/2019   PLT 186 12/13/2019    Patient Active Problem List   Diagnosis Date Noted   Encounter for induction of labor 12/13/2019   Group beta Strep positive 12/06/2019   Consanguinity 12/03/2019   Supervision of high risk pregnancy, antepartum 10/14/2019   GDM (gestational diabetes mellitus) 10/14/2019   Positive TB test 10/14/2019   Normal pregnancy in multigravida in third trimester 11/11/2017   History of postpartum hemorrhage 08/21/2017    Assessment / Plan: 34 y.o. [id number] at [redacted]w[redacted]d here for IOL 2/2 to BPP 6/8 with hx of GDMA2, polyhydramnios and a bartholin cyst.  Labor: progressing, inserted FB @0040 , cytotec x2 @011    Fetal Wellbeing:  Category I  Pain Control:  IV fentanyl  GBS: positive, on PCN  Anticipated MOD:  Vaginal delivery    , MD Family Medicine, PGY-1  12/14/2019, 1:11 AM

## 2019-12-14 NOTE — Progress Notes (Signed)
Labor Progress Note Abigail Knight is a 34 y.o. H3Z1696 at 38w1dpresented for IOL for BPP 6/8, GDMA2 and poly.  S: Feeling some pressure. Met patient and discussed plan.   O:  BP 108/72   Pulse (!) 107   Temp (!) 97.1 F (36.2 C) (Oral)   Resp 18   Ht 5' 2.5" (1.588 m)   Wt 78.4 kg   LMP 03/22/2019   SpO2 100%   BMI 31.11 kg/m  EFM: 145, moderate variability, pos accels, variable decels, reactive TOCO: q1-276mCVE: Dilation: 10 Dilation Complete Date: 12/14/19 Dilation Complete Time: 0810 Effacement (%): 100 Cervical Position: Posterior Station: 0 Presentation: Vertex Exam by:: J Hazelwood   A&P: [age].o. [id number] for IOL for BPP 6/8. #Labor: Progressing well. Now complete but 0 station with minimal progress with practice pushes. Will labor down to lower station.  #Pain: epidural #FWB: Cat II; reassuring for moderate variability and nearing delivery in multiparous patient  #GBS positive; PCN #GDMA2: last glucose 81; Metformin discontinued this morning  #MOC: IUD ordered; RN to pull for room-will place prior to I&D of Bartholin #Bartholin Cyst: Will verbally consent prior and plan to I&D after IUD placement  CheChauncey MannD 8:44 AM

## 2019-12-14 NOTE — Procedures (Signed)
Patient desired post-placental IUD insertion.  IUD Insertion Procedure Note Patient identified, informed consent performed, consent signed.   Discussed risks of irregular bleeding, cramping, infection, malpositioning or misplacement of the IUD outside the uterus which may require further procedure such as laparoscopy. Discussed higher risk of expulsion in post-partum period. Also discussed >99% contraception efficacy, increased risk of ectopic pregnancy with failure of method.  Time out was performed.   Liletta IUD removed from insertor and placed manually to fundus. Strings trimmed to 3 cm from cervix. Patient tolerated procedure well.   Patient was given post-procedure instructions. She will follow-up at post-partum visit.   Lot #: [id number] Exp: 05/2023  Jerilynn Birkenhead, MD OB Family Medicine Fellow, [facility] for [employer], [facility]

## 2019-12-14 NOTE — Procedures (Addendum)
Verbal consent obtained after discussing risks and benefits of the procedure.  No additional anesthesia was needed due to epidural.  An 11 blade was used to make a vertical incision in center of abscess.  Approximately 10-15 cc of purulent, cream-colored, thin liquid was expressed.  Incision was left open.  No appreciable blood loss noted.    Ronna Polio, MD PGY-2  I saw and evaluated the patient. I agree with the findings and the plan of care as documented in the resident's note. Right 4x5 cm right Bartholin cyst visible at the introitus.   Jerilynn Birkenhead, MD [facility] Family Medicine Fellow, [facility] for [employer], [facility]

## 2019-12-14 NOTE — Progress Notes (Signed)
This RN has tried several times to call Interpreter line for [place]. However, either the automated response does not recognize [place] as a language or the line hangs up. Will continue to try for an interpreter.

## 2019-12-14 NOTE — Anesthesia Procedure Notes (Signed)
Epidural Patient location during procedure: OB Start time: 12/14/2019 3:20 AM End time: 12/14/2019 3:26 AM  Staffing Anesthesiologist: Shelton Silvas, MD Performed: anesthesiologist   Preanesthetic Checklist Completed: patient identified, IV checked, site marked, risks and benefits discussed, surgical consent, monitors and equipment checked, pre-op evaluation and timeout performed  Epidural Patient position: sitting Prep: ChloraPrep Patient monitoring: heart rate, continuous pulse ox and blood pressure Approach: midline Location: L3-L4 Injection technique: LOR saline  Needle:  Needle type: Tuohy  Needle gauge: 17 G Needle length: 9 cm Catheter type: closed end flexible Catheter size: 20 Guage Test dose: negative and 1.5% lidocaine  Assessment Events: blood not aspirated, injection not painful, no injection resistance and no paresthesia  Additional Notes LOR @ 4.5  Patient identified. Risks/Benefits/Options discussed with patient including but not limited to bleeding, infection, nerve damage, paralysis, failed block, incomplete pain control, headache, blood pressure changes, nausea, vomiting, reactions to medications, itching and postpartum back pain. Confirmed with bedside nurse the patient's most recent platelet count. Confirmed with patient that they are not currently taking any anticoagulation, have any bleeding history or any family history of bleeding disorders. Patient expressed understanding and wished to proceed. All questions were answered. Sterile technique was used throughout the entire procedure. Please see nursing notes for vital signs. Test dose was given through epidural catheter and negative prior to continuing to dose epidural or start infusion. Warning signs of high block given to the patient including shortness of breath, tingling/numbness in hands, complete motor block, or any concerning symptoms with instructions to call for help. Patient was given instructions  on fall risk and not to get out of bed. All questions and concerns addressed with instructions to call with any issues or inadequate analgesia.    Reason for block:procedure for pain

## 2019-12-15 LAB — CBC
HCT: 30.4 % — ABNORMAL LOW (ref 36.0–46.0)
Hemoglobin: 10 g/dL — ABNORMAL LOW (ref 12.0–15.0)
MCH: 31.4 pg (ref 26.0–34.0)
MCHC: 32.9 g/dL (ref 30.0–36.0)
MCV: 95.6 fL (ref 80.0–100.0)
Platelets: 158 10*3/uL (ref 150–400)
RBC: 3.18 MIL/uL — ABNORMAL LOW (ref 3.87–5.11)
RDW: 15.4 % (ref 11.5–15.5)
WBC: 17.8 10*3/uL — ABNORMAL HIGH (ref 4.0–10.5)
nRBC: 0 % (ref 0.0–0.2)

## 2019-12-15 LAB — GLUCOSE, CAPILLARY
Glucose-Capillary: 119 mg/dL — ABNORMAL HIGH (ref 70–99)
Glucose-Capillary: 101 mg/dL — ABNORMAL HIGH (ref 70–99)

## 2019-12-15 MED ORDER — FERROUS SULFATE 325 (65 FE) MG PO TABS: 325.0000 mg | ORAL_TABLET | Freq: Every day | ORAL | Status: DC

## 2019-12-15 NOTE — Anesthesia Postprocedure Evaluation (Signed)
Anesthesia Post Note  Patient: Abigail Knight  Procedure(s) Performed: AN AD HOC LABOR EPIDURAL     Patient location during evaluation: Mother Baby Anesthesia Type: Epidural Level of consciousness: awake and alert and oriented Pain management: satisfactory to patient Vital Signs Assessment: post-procedure vital signs reviewed and stable Respiratory status: spontaneous breathing and nonlabored ventilation Cardiovascular status: stable Postop Assessment: no headache, no backache, no signs of nausea or vomiting, adequate PO intake, patient able to bend at knees and able to ambulate (patient up walking) Anesthetic complications: no   No complications documented.  Last Vitals:  Vitals:   12/15/19 0054 12/15/19 0420  BP: 128/76 126/79  Pulse:    Resp: 18 17  Temp: 36.7 C 36.8 C  SpO2:  99%    Last Pain:  Vitals:   12/15/19 0556  TempSrc:   PainSc: Asleep   Pain Goal: Patients Stated Pain Goal: 7 (12/13/19 2320)                 Madison Hickman

## 2019-12-15 NOTE — Lactation Note (Signed)
This note was copied from a baby's chart. Lactation Consultation Note  Patient Name: Abigail Knight [id number] Date: 12/15/2019 Reason for consult: Initial assessment;Early term 37-38.6wks;Infant weight loss;Other (Comment) (3 % weight loss/ Limited English, Arabic- [employer] [name] [phone]) [name] is 30 hours old  As LC entered the room mom latching in the cradle position with poor depth,  LC assisted and the baby latched took a few sucks and released. Didn't seem interested.  Baby had a good feeding at 1600 - 20 mins.  Mom is an experienced breast of two other babies.  Per dad active with WIC - [place] and does not have a pump at home , will need a hand pump.  LC provided the hand outs with phone numbers.    Maternal Data Has patient been taught Hand Expression?: Yes (several drops) Does the patient have breastfeeding experience prior to this delivery?: Yes  Feeding Feeding Type: Breast Fed  LATCH Score Latch: Repeated attempts needed to sustain latch, nipple held in mouth throughout feeding, stimulation needed to elicit sucking reflex.  Audible Swallowing: None  Type of Nipple: Everted at rest and after stimulation  Comfort (Breast/Nipple): Soft / non-tender  Hold (Positioning): Assistance needed to correctly position infant at breast and maintain latch.  LATCH Score: 6  Interventions Interventions: Breast feeding basics reviewed;Assisted with latch;Skin to skin;Breast compression;Adjust position;Support pillows;Position options  Lactation Tools Discussed/Used WIC Program: Yes   Consult Status Consult Status: Follow-up Date: 12/16/19 Follow-up type: In-patient    Matilde Sprang Itzayanna Kaster 12/15/2019, 6:51 PM

## 2019-12-15 NOTE — Progress Notes (Signed)
Post Partum Day 1 s/p SVD/  Subjective: no complaints, up ad lib, voiding, tolerating PO and + flatus  Objective: Blood pressure 126/79, pulse 81, temperature 98.2 F (36.8 C), resp. rate 17, height 5' 2.5" (1.588 m), weight 78.4 kg, last menstrual period 03/22/2019, SpO2 99 %, unknown if currently breastfeeding.  Physical Exam:  General: alert, cooperative and no distress Lochia: appropriate Uterine Fundus: firm DVT Evaluation: No evidence of DVT seen on physical exam.  Recent Labs    12/13/19 1726 12/15/19 0544  HGB 13.3 10.0*  HCT 40.8 30.4*    Assessment/Plan: Plan for discharge tomorrow, Breastfeeding and Contraception post-placental IUD placed.  She desires circumcision, but FOB is undecided.  Therefore, will hold off on circumcision for now.   LOS: 2 days   Kash Davie Genene Churn, MD PGY-2 Resident Family Medicine 12/15/2019, 8:13 AM

## 2019-12-16 ENCOUNTER — Encounter: Payer: Medicaid Other | Admitting: Family Medicine

## 2019-12-16 LAB — GLUCOSE, CAPILLARY: Glucose-Capillary: 96 mg/dL (ref 70–99)

## 2019-12-16 MED ORDER — IBUPROFEN 600 MG PO TABS: 600.0000 mg | ORAL_TABLET | Freq: Three times a day (TID) | ORAL | 0 refills | Status: AC | PRN

## 2019-12-16 NOTE — Discharge Instructions (Signed)

## 2019-12-16 NOTE — Lactation Note (Signed)
This note was copied from a baby's chart. Lactation Consultation Note  Patient Name: Abigail Knight [id number] Date: 12/16/2019 Reason for consult: Follow-up assessment;Early term 37-38.6wks;Infant weight loss;Other (Comment) (6 % weight loss / post circ and sleepy - for D/C today - Arabic interpreter from [facility])  Baby is 48 hours old ,  Baby is post circ and sleeping.  Per mom baby breastfeeding well and reports swallows with feedings.  Denies sore nipples.  Sore nipples and engorgement prevention and tx reviewed, storage of breast milk  And the provided a Hand pump with instructions and #24 and #27 F.  Discussed importance of STS feedings until the baby is back to birth weight, gaining steadily and can stay awake for majority of feedings. Nutritive vs non - nutritive feeding patterns and the importance of watching the baby for hanging out latched.  Mom has the [facility] pamphlet with phone numbers and is aware they can call back with breast feeding questions or concerns.  S/S of mastitis reviewed.    Maternal Data    Feeding    LATCH Score                   Interventions Interventions: Breast feeding basics reviewed;Hand pump  Lactation Tools Discussed/Used Tools: Pump;Flanges Flange Size: 24;27 Breast pump type: Manual Pump Review: Milk Storage;Setup, frequency, and cleaning Initiated by:: MAI Date initiated:: 12/16/19   Consult Status Consult Status: Complete Date: 12/16/19    Kathrin Greathouse 12/16/2019, 1:33 PM

## 2019-12-16 NOTE — Lactation Note (Signed)
This note was copied from a baby's chart. Lactation Consultation Note Baby 40 hrs old. FOB interpret for mom. Mom states BF going well. Denies pain w/latching. Mom stated baby is BF better. LC stated baby wasn't BF well yesterday. Mom stated he is BF good now. Asked if they have any questions mom stated no. Discussed mom's milk coming in and engorgement.  Baby sleeping soundly.  This LC feels like an LC should see a latch before d/c just to make sure baby is feeding well. Mom stated this is her 3rd baby and doing well.  Patient Name: Abigail Knight [id number] Date: 12/16/2019 Reason for consult: Follow-up assessment   Maternal Data    Feeding Feeding Type: Bottle Fed - Formula Nipple Type: Slow - flow  LATCH Score       Type of Nipple: Everted at rest and after stimulation  Comfort (Breast/Nipple): Soft / non-tender        Interventions    Lactation Tools Discussed/Used     Consult Status Consult Status: Follow-up Date: 12/16/19 Follow-up type: In-patient    Charyl Dancer 12/16/2019, 5:28 AM

## 2019-12-17 LAB — SURGICAL PATHOLOGY

## 2019-12-18 ENCOUNTER — Other Ambulatory Visit (HOSPITAL_COMMUNITY): Payer: Medicaid Other

## 2019-12-20 ENCOUNTER — Inpatient Hospital Stay (HOSPITAL_COMMUNITY)
Admission: AD | Admit: 2019-12-20 | Payer: Medicaid Other | Source: Home / Self Care | Admitting: Obstetrics & Gynecology

## 2019-12-20 ENCOUNTER — Inpatient Hospital Stay (HOSPITAL_COMMUNITY): Payer: Medicaid Other

## 2020-01-10 ENCOUNTER — Telehealth: Payer: Self-pay

## 2020-01-10 NOTE — Telephone Encounter (Signed)
Called pt in regards to the front office wanting to know if pt needed a sooner appt for swelling. L/M with [employer] # [phone] to call the office if she continues to have questions or concerns.   Addison Naegeli, RN  01/10/20

## 2020-01-14 ENCOUNTER — Ambulatory Visit: Payer: Medicaid Other | Admitting: Student

## 2020-01-17 ENCOUNTER — Other Ambulatory Visit: Payer: Self-pay

## 2020-01-17 ENCOUNTER — Other Ambulatory Visit: Payer: Medicaid Other

## 2020-01-17 ENCOUNTER — Other Ambulatory Visit: Payer: Self-pay | Admitting: General Practice

## 2020-01-17 ENCOUNTER — Ambulatory Visit (INDEPENDENT_AMBULATORY_CARE_PROVIDER_SITE_OTHER): Payer: Medicaid Other

## 2020-01-17 DIAGNOSIS — O24419 Gestational diabetes mellitus in pregnancy, unspecified control: Secondary | ICD-10-CM

## 2020-01-17 DIAGNOSIS — Z1389 Encounter for screening for other disorder: Secondary | ICD-10-CM

## 2020-01-17 DIAGNOSIS — Z8759 Personal history of other complications of pregnancy, childbirth and the puerperium: Secondary | ICD-10-CM

## 2020-01-17 NOTE — Progress Notes (Signed)
    Post Partum Visit Note  Abigail Knight is a 34 y.o. [phone] female who presents for a postpartum visit. She is 4 weeks postpartum following a normal spontaneous vaginal delivery.  I have fully reviewed the prenatal and intrapartum course. The delivery was at 38/1 gestational weeks.  Anesthesia: epidural. Postpartum course has been uneventful. Baby is doing well. Baby is feeding by breast. Bleeding staining only. Bowel function is normal. Bladder function is normal. Patient is not sexually active. Contraception method is IUD. Postpartum depression screening: negative.  The following portions of the patient's history were reviewed and updated as appropriate: allergies, current medications, past family history, past medical history, past social history, past surgical history and problem list.  Review of Systems Pertinent items noted in HPI and remainder of comprehensive ROS otherwise negative.    Objective:  Last menstrual period 03/22/2019, unknown if currently breastfeeding.  General:  alert, cooperative and no distress   Breasts:  inspection negative, no nipple discharge or bleeding, no masses or nodularity palpable  Lungs: clear to auscultation bilaterally  Heart:  regular rate and rhythm, S1, S2 normal, no murmur, click, rub or gallop  Abdomen: soft, non-tender; bowel sounds normal; no masses,  no organomegaly   Vulva:  negative for erythema bilaterally, laceration appears to be healing well and swelling none  Vagina: normal vagina, no discharge, exudate, lesion, or erythema  Cervix:  multiparous appearance and IUD strings visible  Corpus: not examined  Adnexa:  not evaluated  Rectal Exam: Not performed.        Assessment:    Normal postpartum exam. Pap smear not done at today's visit.   Plan:   Essential components of care per ACOG recommendations:  1.  Mood and well being: Patient with negative depression screening today. Reviewed local resources for support.  - Patient does  not use tobacco.  - hx of drug use? No    2. Infant care and feeding:  -Patient currently breastmilk feeding? Yes  -Social determinants of health (SDOH) reviewed in EPIC. No concerns  3. Sexuality, contraception and birth spacing - Patient does not want a pregnancy in the next year.  - Reviewed forms of contraception in tiered fashion. Patient desired IUD today.   - Discussed birth spacing of 18 months  4. Sleep and fatigue -Encouraged family/partner/community support of 4 hrs of uninterrupted sleep to help with mood and fatigue  5. Physical Recovery  - Discussed patients delivery and complications - Patient had a 1st degree laceration, perineal healing reviewed. Patient expressed understanding - Patient has urinary incontinence? No - Patient is not safe to resume physical and sexual activity. Reviewed waiting until 6 weeks postpartum and feeling physically well and ready  6.  Health Maintenance - Last pap smear done 2/21 and was normal but endocervical cells missing. Will repeat in 1 year   Rennie Plowman  01/17/20 8:58 AM  Center for [employer], [facility]

## 2020-01-21 ENCOUNTER — Other Ambulatory Visit: Payer: Medicaid Other

## 2020-02-03 ENCOUNTER — Other Ambulatory Visit: Payer: Medicaid Other

## 2020-02-03 ENCOUNTER — Other Ambulatory Visit: Payer: Self-pay

## 2020-02-03 DIAGNOSIS — O24419 Gestational diabetes mellitus in pregnancy, unspecified control: Secondary | ICD-10-CM

## 2020-02-04 LAB — GLUCOSE TOLERANCE, 2 HOURS
Glucose, 2 hour: 134 mg/dL (ref 65–139)
Glucose, GTT - Fasting: 86 mg/dL (ref 65–99)

## 2021-06-10 ENCOUNTER — Encounter (HOSPITAL_COMMUNITY): Payer: Self-pay

## 2021-06-10 ENCOUNTER — Emergency Department (HOSPITAL_COMMUNITY): Payer: Medicaid Other

## 2021-06-10 ENCOUNTER — Emergency Department (HOSPITAL_COMMUNITY)
Admission: EM | Admit: 2021-06-10 | Discharge: 2021-06-10 | Disposition: A | Discharge status: Home / Self Care | Payer: Medicaid Other | Attending: Emergency Medicine | Admitting: Emergency Medicine

## 2021-06-10 DIAGNOSIS — B349 Viral infection, unspecified: Secondary | ICD-10-CM | POA: Diagnosis not present

## 2021-06-10 DIAGNOSIS — Z20822 Contact with and (suspected) exposure to covid-19: Secondary | ICD-10-CM | POA: Diagnosis not present

## 2021-06-10 DIAGNOSIS — N9489 Other specified conditions associated with female genital organs and menstrual cycle: Secondary | ICD-10-CM | POA: Diagnosis not present

## 2021-06-10 DIAGNOSIS — R109 Unspecified abdominal pain: Secondary | ICD-10-CM | POA: Diagnosis present

## 2021-06-10 LAB — CBC WITH DIFFERENTIAL/PLATELET
Abs Immature Granulocytes: 0.03 10*3/uL (ref 0.00–0.07)
Basophils Absolute: 0 10*3/uL (ref 0.0–0.1)
Basophils Relative: 0 %
Eosinophils Absolute: 0 10*3/uL (ref 0.0–0.5)
Eosinophils Relative: 0 %
HCT: 40.4 % (ref 36.0–46.0)
Hemoglobin: 13.3 g/dL (ref 12.0–15.0)
Immature Granulocytes: 0 %
Lymphocytes Relative: 14 %
Lymphs Abs: 1.3 10*3/uL (ref 0.7–4.0)
MCH: 29 pg (ref 26.0–34.0)
MCHC: 32.9 g/dL (ref 30.0–36.0)
MCV: 88 fL (ref 80.0–100.0)
Monocytes Absolute: 0.9 10*3/uL (ref 0.1–1.0)
Monocytes Relative: 9 %
Neutro Abs: 7 10*3/uL (ref 1.7–7.7)
Neutrophils Relative %: 77 %
Platelets: 260 10*3/uL (ref 150–400)
RBC: 4.59 MIL/uL (ref 3.87–5.11)
RDW: 13.5 % (ref 11.5–15.5)
WBC: 9.2 10*3/uL (ref 4.0–10.5)
nRBC: 0 % (ref 0.0–0.2)

## 2021-06-10 LAB — URINALYSIS, ROUTINE W REFLEX MICROSCOPIC
Bilirubin Urine: NEGATIVE
Glucose, UA: NEGATIVE mg/dL
Ketones, ur: 20 mg/dL — AB
Leukocytes,Ua: NEGATIVE
Nitrite: NEGATIVE
Protein, ur: 100 mg/dL — AB
Specific Gravity, Urine: 1.034 — ABNORMAL HIGH (ref 1.005–1.030)
pH: 5 (ref 5.0–8.0)

## 2021-06-10 LAB — COMPREHENSIVE METABOLIC PANEL
ALT: 18 U/L (ref 0–44)
AST: 15 U/L (ref 15–41)
Albumin: 4.2 g/dL (ref 3.5–5.0)
Alkaline Phosphatase: 97 U/L (ref 38–126)
Anion gap: 7 (ref 5–15)
BUN: 13 mg/dL (ref 6–20)
CO2: 20 mmol/L — ABNORMAL LOW (ref 22–32)
Calcium: 8.9 mg/dL (ref 8.9–10.3)
Chloride: 106 mmol/L (ref 98–111)
Creatinine, Ser: 0.51 mg/dL (ref 0.44–1.00)
GFR, Estimated: >60 mL/min (ref >60)
Glucose, Bld: 128 mg/dL — ABNORMAL HIGH (ref 70–99)
Potassium: 3.1 mmol/L — ABNORMAL LOW (ref 3.5–5.1)
Sodium: 133 mmol/L — ABNORMAL LOW (ref 135–145)
Total Bilirubin: 0.6 mg/dL (ref 0.3–1.2)
Total Protein: 7.7 g/dL (ref 6.5–8.1)

## 2021-06-10 LAB — I-STAT BETA HCG BLOOD, ED (MC, WL, AP ONLY): I-stat hCG, quantitative: <5 m[IU]/mL (ref <5)

## 2021-06-10 LAB — RESP PANEL BY RT-PCR (FLU A&B, COVID) ARPGX2
Influenza A by PCR: NEGATIVE
Influenza B by PCR: NEGATIVE
SARS Coronavirus 2 by RT PCR: NEGATIVE

## 2021-06-10 MED ORDER — ACETAMINOPHEN 325 MG PO TABS
650.0000 mg | ORAL_TABLET | Freq: Once | ORAL | Status: AC
  Administered 2021-06-10: 650 mg via ORAL
  Filled 2021-06-10: qty 2

## 2021-06-10 MED ORDER — SODIUM CHLORIDE 0.9 % IV BOLUS: 1000.0000 mL | Freq: Once | INTRAVENOUS | Status: DC

## 2021-06-10 MED ORDER — ONDANSETRON HCL 4 MG PO TABS
4.0000 mg | ORAL_TABLET | Freq: Once | ORAL | Status: AC
  Administered 2021-06-10: 4 mg via ORAL
  Filled 2021-06-10: qty 1

## 2021-06-10 MED ORDER — ONDANSETRON HCL 4 MG/2ML IJ SOLN: 4.0000 mg | Freq: Once | INTRAMUSCULAR | Status: DC

## 2021-06-10 MED ORDER — ONDANSETRON HCL 4 MG PO TABS: 4.0000 mg | ORAL_TABLET | Freq: Four times a day (QID) | ORAL | 0 refills | Status: AC

## 2021-06-10 NOTE — Discharge Instructions (Addendum)
Return for any problem.  ?

## 2021-06-10 NOTE — ED Provider Notes (Signed)
°  Emergency Medicine Provider in Triage Note   MSE was initiated and I personally evaluated the patient  6:23 AM on June 10, 2021 as provider in triage.   Chief Complaint: N/V/D  HPI  Patient is a 35 y.o. who presents to the ED via EMS with complaints of flu like sxs since yesterday. Patient reports chills, body aches, post nasal drip, nausea, 1 episode of emesis and 4-5 episodes of diarrhea. She has some lower abdominal discomfort just before having a BM which is alleviated after she goes to the bathroom. No other alleviating/aggravating factors.  Interpretor utilized throughout Audiological scientist.   Review of Systems  Positive: N/V/D, abdominal pain, post nasal drip, body aches, chills Negative: fever,  cough, dyspnea, hematemesis, melena, hematochezia, or dysuria.    Physical Exam  BP 113/85 (BP Location: Left Arm)    Pulse (!) 103    Temp 99.8 F (37.7 C) (Oral)    Resp 18    SpO2 100%    Gen:   Awake, no distress   HEENT:  Atraumatic  Resp:  Normal effort  Cardiac:  Tachycardic.  Abd:   No peritoneal signs.  MSK:   Moves extremities without difficulty   Medical Decision Making   Initiation of care has begun. The patient has been counseled on the process, plan, and necessity for staying for the completion/evaluation, informed that the remainder of the evaluation will be completed by another provider, this initial triage assessment does not replace that evaluation, and the importance of remaining in the ED until their evaluation is complete.  Clinical Impression  N/V/D.         Desmond Lope 06/10/21 1191    Geoffery Lyons, MD 06/11/21 [id number]

## 2021-06-10 NOTE — ED Triage Notes (Signed)
Patient BIB EMS with complaint of flu like symptoms x 1 day. Pt also endorses SOB, SPO2 98% RA.   EMS vitals:  BP 118/82 HR 120  CBG 155 T 98.1

## 2021-06-10 NOTE — ED Provider Notes (Signed)
[facility]-EMERGENCY DEPT Provider Note   CSN: [id number] Arrival date & time: 06/10/21  5361     History Chief Complaint  Patient presents with   Flu like symptoms     Abigail Knight is a 35 y.o. female.  35 year old female with prior medical history as detailed below presents for evaluation.  Patient complains of chills, body aches, nausea, loose stools, crampy abdominal discomfort.  The symptoms also yesterday.  She denies fever.  She did not take anything at home for her symptoms.  She denies urinary symptoms.  Patient with improved symptoms at the time of my evaluation.  The history is provided by the patient. A language interpreter was used.  Illness Location:  Nausea, chills, body aches, loose stool Severity:  Mild Onset quality:  Gradual Duration:  1 day Timing:  Constant Progression:  Resolved Chronicity:  New     Past Medical History:  Diagnosis Date   Blood transfusion without reported diagnosis    PPH   Gestational diabetes    History of postpartum hemorrhage, currently pregnant    Medical history non-contributory    Normal pregnancy in multigravida in third trimester 11/11/2017   SVD (spontaneous vaginal delivery) 11/11/2017   Vaginal cyst     Patient Active Problem List   Diagnosis Date Noted   PPH (postpartum hemorrhage) 12/14/2019   Placental abruption affecting delivery 12/14/2019   Encounter for induction of labor 12/13/2019   Group beta Strep positive 12/06/2019   Consanguinity 12/03/2019   Supervision of high risk pregnancy, antepartum 10/14/2019   GDM (gestational diabetes mellitus) 10/14/2019   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 No pertinent family history.  Social History   Tobacco Use   Smoking status: Never   Smokeless tobacco: Never  Substance Use Topics   Alcohol use: No   Drug use: No    Home Medications Prior to Admission medications   Medication Sig Start Date End Date Taking? Authorizing Provider  ondansetron (ZOFRAN) 4 MG tablet Take 1 tablet (4 mg total) by mouth every 6 (six) hours. 06/10/21  Yes Wynetta Fines, MD  ibuprofen (ADVIL) 600 MG tablet Take 1 tablet (600 mg total) by mouth every 8 (eight) hours as needed for mild pain. 12/16/19   Bernerd Limbo, CNM  Prenatal Vit-Fe Fumarate-FA (PRENATAL VITAMINS) 28-0.8 MG TABS Take 1 tablet by mouth daily. 11/12/17   Bovard-Stuckert, Augusto Gamble, MD    Allergies    Patient has no known allergies.  Review of Systems   Review of Systems  All other systems reviewed and are negative.  Physical Exam Updated Vital Signs BP 123/78 (BP Location: Right Arm)    Pulse 89    Temp 99.8 F (37.7 C) (Oral)    Resp 17    SpO2 97%  Physical Exam Vitals and nursing note reviewed.  Constitutional:      General: She is not in acute distress.    Appearance: Normal appearance. She is well-developed.  HENT:     Head: Normocephalic and atraumatic.  Eyes:     Conjunctiva/sclera: Conjunctivae normal.     Pupils: Pupils are equal, round, and reactive to light.  Cardiovascular:     Rate and Rhythm: Normal rate and regular rhythm.     Heart sounds: Normal heart sounds.  Pulmonary:     Effort: Pulmonary effort is normal. No respiratory distress.     Breath sounds: Normal breath sounds.  Abdominal:     General: There is no distension.     Palpations: Abdomen is soft.     Tenderness: There is no abdominal tenderness.  Musculoskeletal:        General: No deformity. Normal range of motion.     Cervical back: Normal range of motion  and neck supple.  Skin:    General: Skin is warm and dry.  Neurological:     General: No focal deficit present.     Mental Status: She is alert and oriented to person, place, and time.    ED Results / Procedures / Treatments   Labs (all labs ordered are listed, but only abnormal results are displayed) Labs Reviewed  COMPREHENSIVE METABOLIC PANEL - Abnormal; Notable for the following components:      Result Value   Sodium 133 (*)    Potassium 3.1 (*)    CO2 20 (*)    Glucose, Bld 128 (*)    All other components within normal limits  URINALYSIS, ROUTINE W REFLEX MICROSCOPIC - Abnormal; Notable for the following components:   APPearance HAZY (*)    Specific Gravity, Urine 1.034 (*)    Hgb urine dipstick SMALL (*)    Ketones, ur 20 (*)    Protein, ur 100 (*)    Bacteria, UA FEW (*)    All other components within normal limits  RESP PANEL BY RT-PCR (FLU A&B, COVID) ARPGX2  CBC WITH DIFFERENTIAL/PLATELET  I-STAT BETA HCG BLOOD, ED ([facility], [facility], [facility] ONLY)    EKG None  Radiology DG Chest Portable 1 View  Result Date: 06/10/2021 CLINICAL DATA:  Shortness of breath with body aches EXAM: PORTABLE CHEST 1 VIEW COMPARISON:  09/05/2019 FINDINGS: Normal heart size and mediastinal contours. No acute infiltrate or edema. No effusion or pneumothorax. No acute osseous findings. IMPRESSION: Negative chest. Electronically Signed   By: Tiburcio Pea M.D.   On: 06/10/2021 05:51    Procedures Procedures   Medications Ordered in ED Medications  acetaminophen (TYLENOL) tablet 650 mg (650 mg Oral Given 06/10/21 0538)  ondansetron (ZOFRAN) tablet 4 mg (4 mg Oral Given 06/10/21 0808)    ED Course  I have reviewed the triage vital signs and the nursing notes.  Pertinent labs & imaging results that were available during my care of the patient were reviewed by me and considered in my medical decision making (see chart for details).    MDM Rules/Calculators/A&P                            MDM  MSE complete  Abigail Knight was evaluated in Emergency Department on 06/10/2021 for the symptoms described in the history of present illness. She was evaluated in the context of the global COVID-19 pandemic, which necessitated consideration that the patient might be at risk for infection  with the SARS-CoV-2 virus that causes COVID-19. Institutional protocols and algorithms that pertain to the evaluation of patients at risk for COVID-19 are in a state of rapid change based on information released by regulatory bodies including the CDC and federal and state organizations. These policies and algorithms were followed during the patient's care in the ED.  Patient is presenting with complaint of constellation of symptoms most consistent with likely viral syndrome.  COVID and flu swabs specifically were negative.  Screening labs were without significant abnormality.  Patient feels significantly improved after her ED evaluation.  She now desires DC home.  Importance of close follow-up is stressed.  Discharge instructions given with assistance of Print production planner.  Strict return precautions given and understood.   Final Clinical Impression(s) / ED Diagnoses Final diagnoses:  Viral syndrome    Rx / DC Orders ED Discharge Orders          Ordered    ondansetron (ZOFRAN) 4 MG tablet  Every 6 hours        06/10/21 1042             Wynetta Fines, MD 06/10/21 1045

## 2021-09-28 IMAGING — CR DG CHEST 1V
1 series · 1 of 1 positions shown · non-contrast
Comparison: No priors.

CLINICAL DATA: 33-year-old female with history of positive
QuantiFERON TB gold test. No current chest complaints.

EXAM:
CHEST  1 VIEW

[w chest pa]
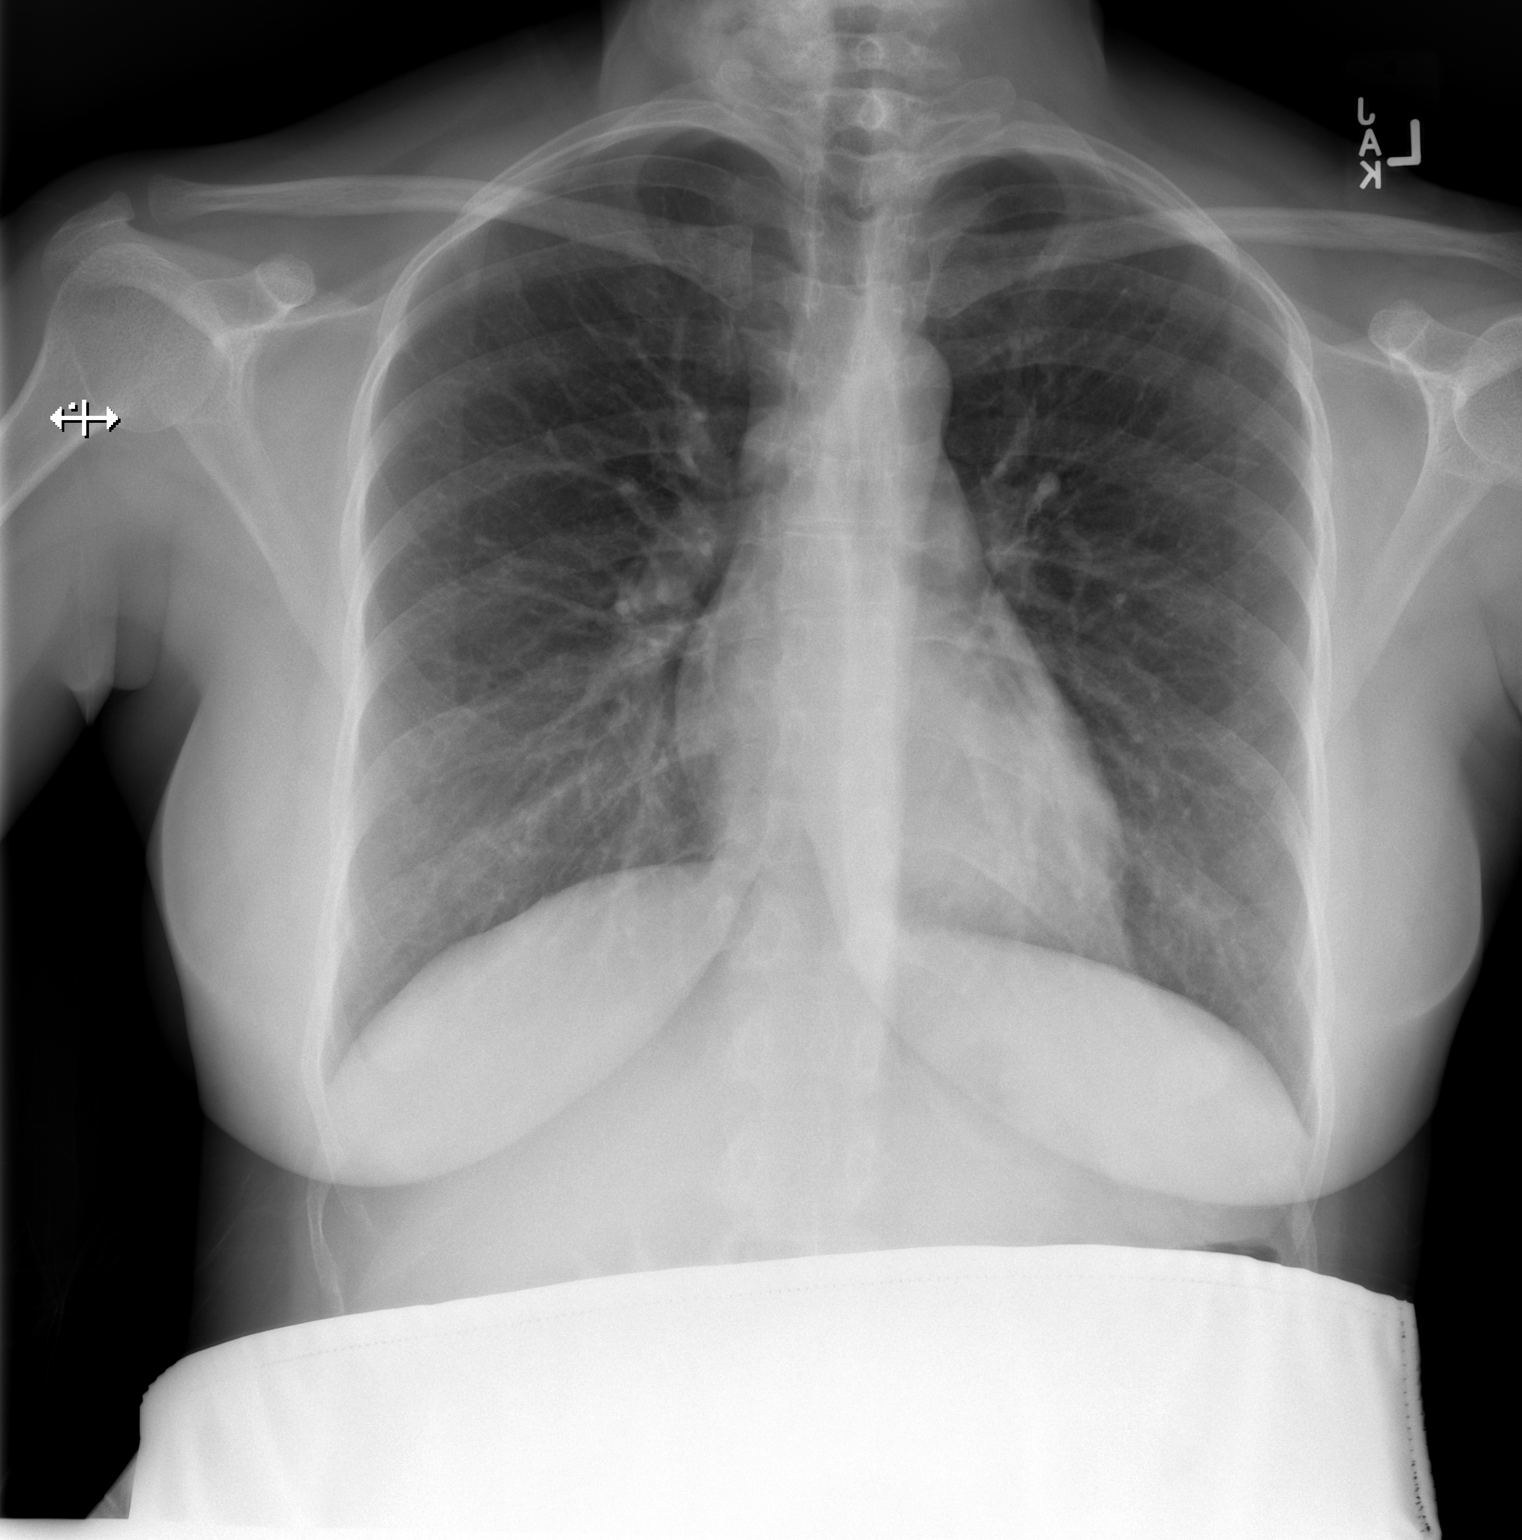

[1 of 1 positions shown; findings below may reference images not displayed]

FINDINGS: Lung volumes are normal. No consolidative airspace disease. No
pleural effusions. No pneumothorax. No pulmonary nodule or mass
noted. Pulmonary vasculature and the cardiomediastinal silhouette
are within normal limits.
IMPRESSION: 1. No radiographic evidence of acute cardiopulmonary disease.
Specifically, no evidence of active pulmonary tuberculosis.

## 2022-01-07 ENCOUNTER — Emergency Department (HOSPITAL_COMMUNITY)
Admission: EM | Admit: 2022-01-07 | Discharge: 2022-01-08 | Disposition: A | Discharge status: Home / Self Care | Payer: Medicaid Other | Attending: Emergency Medicine | Admitting: Emergency Medicine

## 2022-01-07 ENCOUNTER — Emergency Department (HOSPITAL_COMMUNITY): Payer: Medicaid Other

## 2022-01-07 ENCOUNTER — Encounter (HOSPITAL_COMMUNITY): Payer: Self-pay

## 2022-01-07 ENCOUNTER — Other Ambulatory Visit: Payer: Self-pay

## 2022-01-07 DIAGNOSIS — N9489 Other specified conditions associated with female genital organs and menstrual cycle: Secondary | ICD-10-CM | POA: Diagnosis not present

## 2022-01-07 DIAGNOSIS — R112 Nausea with vomiting, unspecified: Secondary | ICD-10-CM | POA: Diagnosis not present

## 2022-01-07 DIAGNOSIS — R1084 Generalized abdominal pain: Secondary | ICD-10-CM | POA: Diagnosis not present

## 2022-01-07 DIAGNOSIS — R0789 Other chest pain: Secondary | ICD-10-CM | POA: Diagnosis not present

## 2022-01-07 DIAGNOSIS — Z20822 Contact with and (suspected) exposure to covid-19: Secondary | ICD-10-CM | POA: Diagnosis not present

## 2022-01-07 DIAGNOSIS — R0602 Shortness of breath: Secondary | ICD-10-CM | POA: Diagnosis not present

## 2022-01-07 DIAGNOSIS — R079 Chest pain, unspecified: Secondary | ICD-10-CM | POA: Diagnosis present

## 2022-01-07 DIAGNOSIS — B349 Viral infection, unspecified: Secondary | ICD-10-CM

## 2022-01-07 LAB — URINALYSIS, ROUTINE W REFLEX MICROSCOPIC
Bilirubin Urine: NEGATIVE
Glucose, UA: NEGATIVE mg/dL
Ketones, ur: 5 mg/dL — AB
Leukocytes,Ua: NEGATIVE
Nitrite: NEGATIVE
Protein, ur: NEGATIVE mg/dL
Specific Gravity, Urine: >1.046 — ABNORMAL HIGH (ref 1.005–1.030)
pH: 8 (ref 5.0–8.0)

## 2022-01-07 LAB — COMPREHENSIVE METABOLIC PANEL
ALT: 24 U/L (ref 0–44)
AST: 19 U/L (ref 15–41)
Albumin: 4.5 g/dL (ref 3.5–5.0)
Alkaline Phosphatase: 118 U/L (ref 38–126)
Anion gap: 13 (ref 5–15)
BUN: 10 mg/dL (ref 6–20)
CO2: 18 mmol/L — ABNORMAL LOW (ref 22–32)
Calcium: 10.1 mg/dL (ref 8.9–10.3)
Chloride: 106 mmol/L (ref 98–111)
Creatinine, Ser: 0.72 mg/dL (ref 0.44–1.00)
GFR, Estimated: >60 mL/min (ref >60)
Glucose, Bld: 155 mg/dL — ABNORMAL HIGH (ref 70–99)
Potassium: 3.7 mmol/L (ref 3.5–5.1)
Sodium: 137 mmol/L (ref 135–145)
Total Bilirubin: 0.8 mg/dL (ref 0.3–1.2)
Total Protein: 8.6 g/dL — ABNORMAL HIGH (ref 6.5–8.1)

## 2022-01-07 LAB — RESP PANEL BY RT-PCR (FLU A&B, COVID) ARPGX2
Influenza A by PCR: NEGATIVE
Influenza B by PCR: NEGATIVE
SARS Coronavirus 2 by RT PCR: NEGATIVE

## 2022-01-07 LAB — CBC WITH DIFFERENTIAL/PLATELET
Abs Immature Granulocytes: 0.11 10*3/uL — ABNORMAL HIGH (ref 0.00–0.07)
Basophils Absolute: 0.1 10*3/uL (ref 0.0–0.1)
Basophils Relative: 0 %
Eosinophils Absolute: 0 10*3/uL (ref 0.0–0.5)
Eosinophils Relative: 0 %
HCT: 43.2 % (ref 36.0–46.0)
Hemoglobin: 14.7 g/dL (ref 12.0–15.0)
Immature Granulocytes: 1 %
Lymphocytes Relative: 9 %
Lymphs Abs: 1.3 10*3/uL (ref 0.7–4.0)
MCH: 29.1 pg (ref 26.0–34.0)
MCHC: 34 g/dL (ref 30.0–36.0)
MCV: 85.5 fL (ref 80.0–100.0)
Monocytes Absolute: 0.8 10*3/uL (ref 0.1–1.0)
Monocytes Relative: 5 %
Neutro Abs: 13 10*3/uL — ABNORMAL HIGH (ref 1.7–7.7)
Neutrophils Relative %: 85 %
Platelets: 302 10*3/uL (ref 150–400)
RBC: 5.05 MIL/uL (ref 3.87–5.11)
RDW: 12.8 % (ref 11.5–15.5)
WBC: 15.3 10*3/uL — ABNORMAL HIGH (ref 4.0–10.5)
nRBC: 0 % (ref 0.0–0.2)

## 2022-01-07 LAB — CBG MONITORING, ED: Glucose-Capillary: 148 mg/dL — ABNORMAL HIGH (ref 70–99)

## 2022-01-07 LAB — I-STAT BETA HCG BLOOD, ED (MC, WL, AP ONLY): I-stat hCG, quantitative: <5 m[IU]/mL (ref <5)

## 2022-01-07 LAB — D-DIMER, QUANTITATIVE: D-Dimer, Quant: 0.7 ug/mL-FEU — ABNORMAL HIGH (ref 0.00–0.50)

## 2022-01-07 LAB — TROPONIN I (HIGH SENSITIVITY)
Troponin I (High Sensitivity): <2 ng/L (ref <18)
Troponin I (High Sensitivity): <2 ng/L (ref <18)

## 2022-01-07 LAB — LIPASE, BLOOD: Lipase: 24 U/L (ref 11–51)

## 2022-01-07 MED ORDER — IOHEXOL 350 MG/ML SOLN
100.0000 mL | Freq: Once | INTRAVENOUS | Status: AC | PRN
  Administered 2022-01-07: 100 mL via INTRAVENOUS

## 2022-01-07 MED ORDER — SODIUM CHLORIDE 0.9 % IV BOLUS
1000.0000 mL | Freq: Once | INTRAVENOUS | Status: AC
  Administered 2022-01-07: 1000 mL via INTRAVENOUS

## 2022-01-07 MED ORDER — ONDANSETRON HCL 4 MG/2ML IJ SOLN
4.0000 mg | Freq: Once | INTRAMUSCULAR | Status: AC
  Administered 2022-01-07: 4 mg via INTRAVENOUS
  Filled 2022-01-07: qty 2

## 2022-01-07 NOTE — ED Triage Notes (Signed)
Patient c/o generalized body pain and a sore throat. Patient states that her symptoms started at 1200 today.  Patient states she had a fever this AM.

## 2022-01-07 NOTE — ED Provider Notes (Addendum)
[facility]-EMERGENCY DEPT Provider Note   CSN: [id number] Arrival date & time: 01/07/22  1816     History  Chief Complaint  Patient presents with   Generalized Body Aches   Sore Throat    Abigail Knight is a 36 y.o. female who presents to the emergency department with concerns for generalized body aches onset today.  Notes that she has had a sore throat that has resolved at this time.  Has associated nausea, vomiting, chest pain, shortness of breath, abdominal pain, more so suprapubic. She voices concerns for pregnancy. No meds tried prior to arrival.  Patient's last menstrual cycle was on 12/09/2021.  Denies hematuria, dysuria, vaginal bleeding, sore throat, fever, cough, rhinorrhea, nasal congestion.  Patient denies birth control, estrogen therapy, history of DVT/PE, recent surgery, recent immobilization, history of malignancy.  The history is provided by the patient. A language interpreter was used (arabic).       Home Medications Prior to Admission medications   Medication Sig Start Date End Date Taking? Authorizing Provider  ibuprofen (ADVIL) 600 MG tablet Take 1 tablet (600 mg total) by mouth every 8 (eight) hours as needed for mild pain. 12/16/19   Bernerd Limbo, CNM  ondansetron (ZOFRAN) 4 MG tablet Take 1 tablet (4 mg total) by mouth every 6 (six) hours. 06/10/21   Wynetta Fines, MD  Prenatal Vit-Fe Fumarate-FA (PRENATAL VITAMINS) 28-0.8 MG TABS Take 1 tablet by mouth daily. 11/12/17   Bovard-Stuckert, Augusto Gamble, MD      Allergies    Patient has no known allergies.    Review of Systems   Review of Systems  Constitutional:  Negative for fever.  HENT:  Negative for congestion, rhinorrhea and sore throat.   Respiratory:  Positive for shortness of breath. Negative for cough.   Cardiovascular:  Positive for chest pain.  Gastrointestinal:  Positive for abdominal pain, nausea and vomiting.  Genitourinary:  Negative for dysuria, hematuria and vaginal  bleeding.  All other systems reviewed and are negative.   Physical Exam Updated Vital Signs BP 116/64 (BP Location: Left Arm)   Pulse (!) 126   Temp 98.6 F (37 C) (Oral)   Resp 18   Ht 5\' 5"  (1.651 m)   Wt 70.3 kg   LMP 12/09/2021 (Approximate)   SpO2 100%   BMI 25.79 kg/m  Physical Exam Vitals and nursing note reviewed.  Constitutional:      General: She is not in acute distress.    Appearance: She is not diaphoretic.  HENT:     Head: Normocephalic and atraumatic.     Mouth/Throat:     Pharynx: No oropharyngeal exudate.  Eyes:     General: No scleral icterus.    Conjunctiva/sclera: Conjunctivae normal.  Cardiovascular:     Rate and Rhythm: Regular rhythm. Tachycardia present.     Pulses: Normal pulses.     Heart sounds: Normal heart sounds.  Pulmonary:     Effort: Pulmonary effort is normal. No respiratory distress.     Breath sounds: Normal breath sounds. No wheezing.  Abdominal:     General: Bowel sounds are normal.     Palpations: Abdomen is soft. There is no mass.     Tenderness: There is generalized abdominal tenderness. There is no guarding or rebound.  Musculoskeletal:        General: Normal range of motion.     Cervical back: Normal range of motion and neck supple.  Skin:    General: Skin is warm and  dry.  Neurological:     Mental Status: She is alert.  Psychiatric:        Behavior: Behavior normal.     ED Results / Procedures / Treatments   Labs (all labs ordered are listed, but only abnormal results are displayed) Labs Reviewed  CBC WITH DIFFERENTIAL/PLATELET - Abnormal; Notable for the following components:      Result Value   WBC 15.3 (*)    Neutro Abs 13.0 (*)    Abs Immature Granulocytes 0.11 (*)    All other components within normal limits  COMPREHENSIVE METABOLIC PANEL - Abnormal; Notable for the following components:   CO2 18 (*)    Glucose, Bld 155 (*)    Total Protein 8.6 (*)    All other components within normal limits  D-DIMER,  QUANTITATIVE - Abnormal; Notable for the following components:   D-Dimer, Quant 0.70 (*)    All other components within normal limits  CBG MONITORING, ED - Abnormal; Notable for the following components:   Glucose-Capillary 148 (*)    All other components within normal limits  LIPASE, BLOOD  URINALYSIS, ROUTINE W REFLEX MICROSCOPIC  I-STAT BETA HCG BLOOD, ED ([facility], [facility], [facility] ONLY)  TROPONIN I (HIGH SENSITIVITY)  TROPONIN I (HIGH SENSITIVITY)    EKG EKG Interpretation  Date/Time:  Friday January 07 2022 19:21:52 EDT Ventricular Rate:  124 PR Interval:  184 QRS Duration: 91 QT Interval:  291 QTC Calculation: 418 R Axis:   33 Text Interpretation: Sinus tachycardia Low voltage, precordial leads No previous ECGs available Confirmed by Richardean Canal [id number]) on 01/07/2022 9:02:45 PM  Radiology DG Chest 2 View  Result Date: 01/07/2022 CLINICAL DATA:  Short of breath, generalized body pain EXAM: CHEST - 2 VIEW COMPARISON:  06/10/2021 FINDINGS: The heart size and mediastinal contours are within normal limits. Both lungs are clear. The visualized skeletal structures are unremarkable. IMPRESSION: No active cardiopulmonary disease. Electronically Signed   By: Sharlet Salina M.D.   On: 01/07/2022 20:00    Procedures Procedures    Medications Ordered in ED Medications  ondansetron [facility]) injection 4 mg (4 mg Intravenous Given 01/07/22 2044)  sodium chloride 0.9 % bolus 1,000 mL (1,000 mLs Intravenous New Bag/Given 01/07/22 2044)  iohexol (OMNIPAQUE) 350 MG/ML injection 100 mL (100 mLs Intravenous Contrast Given 01/07/22 2154)    ED Course/ Medical Decision Making/ A&P Clinical Course as of 01/07/22 2159  Fri Jan 07, 2022  2019 WBC(!): 15.3 [SB]  2031 Pt sleeping comfortably on stretcher. Pt re-evaluated and discussed with patient treatment plan in the ED. Pt agreeable at this time. [SB]  2100 D-Dimer, Quant(!): 0.70 [SB]    Clinical Course User Index [SB] Lakendra Helling A, PA-C                            Medical Decision Making Amount and/or Complexity of Data Reviewed Labs: ordered. Decision-making details documented in ED Course. Radiology: ordered.  Risk Prescription drug management.   Pt presents with concerns for generalized body aches onset today.  Also concerns for chest pain, shortness of breath, abdominal pain.  Also voices concern for pregnancy at this time.  Patient afebrile.  Patient tachycardic.  On exam patient with tachycardia noted.  Generalized abdominal tenderness to palpation noted.  No acute respiratory exam findings. Differential diagnosis includes PE, ACS, cholecystitis, pancreatitis, appendicitis, acute cystitis.   Labs:  I ordered, and personally interpreted labs.  The pertinent results include:  Initial troponin at less than 2 I-STAT beta-hCG less than 5. CBG at 148 D-dimer elevated 8.7. Lipase at 24 and unremarkable. CBC with WBC at 15.3 otherwise unremarkable. CMP with slightly elevated glucose at 155, bicarb slightly downtrending at 18, otherwise unremarkable  Imaging: I ordered imaging studies including CTA chest, CT abdomen pelvis, chest x-ray I independently visualized and interpreted imaging which showed: No acute findings on chest x-ray.  I agree with the radiologist interpretation  Medications:  I ordered medication including IV fluids, Zofran for symptom management I have reviewed the patients home medicines and have made adjustments as needed  Patient case discussed with Loleta Dicker, PA-C at sign-out. Plan at sign-out is pending CT imaging, likely discharge home if no acute findings on imaging. Plans for dispo may change pending oncoming team. Patient care transferred at sign out.    This chart was dictated using voice recognition software, Dragon. Despite the best efforts of this provider to proofread and correct errors, errors may still occur which can change documentation meaning.    Final Clinical Impression(s) / ED  Diagnoses Final diagnoses:  Shortness of breath  Atypical chest pain  Nausea and vomiting, unspecified vomiting type    Rx / DC Orders ED Discharge Orders     None         Akyia Borelli A, PA-C 01/07/22 2221    Macallister Ashmead A, PA-C 01/07/22 2221    Charlynne Pander, MD 01/07/22 [phone]

## 2022-01-07 NOTE — ED Provider Notes (Signed)
Physical Exam  BP 126/61   Pulse (!) 115   Temp 98.6 F (37 C) (Oral)   Resp (!) 23   Ht 5\' 5"  (1.651 m)   Wt 70.3 kg   LMP 12/09/2021 (Approximate)   SpO2 100%   BMI 25.79 kg/m   Physical Exam Vitals and nursing note reviewed. Exam conducted with a chaperone present.  Constitutional:      Appearance: She is not ill-appearing or toxic-appearing.  HENT:     Head: Normocephalic and atraumatic.     Mouth/Throat:     Mouth: Mucous membranes are moist.     Pharynx: No oropharyngeal exudate or posterior oropharyngeal erythema.  Eyes:     General:        Right eye: No discharge.        Left eye: No discharge.     Extraocular Movements: Extraocular movements intact.     Conjunctiva/sclera: Conjunctivae normal.     Pupils: Pupils are equal, round, and reactive to light.  Cardiovascular:     Rate and Rhythm: Regular rhythm. Tachycardia present.     Pulses: Normal pulses.     Heart sounds: Normal heart sounds. No murmur heard. Pulmonary:     Effort: Pulmonary effort is normal. No respiratory distress.     Breath sounds: Normal breath sounds. No wheezing or rales.  Abdominal:     General: Bowel sounds are normal. There is no distension.     Palpations: Abdomen is soft.     Tenderness: There is no abdominal tenderness. There is no guarding or rebound.  Genitourinary:    General: Normal vulva.     Exam position: Lithotomy position.     Vagina: Prolapsed vaginal walls present.     Cervix: Normal.     Uterus: Normal.      Adnexa: Right adnexa normal and left adnexa normal.    Musculoskeletal:        General: No deformity.     Cervical back: Neck supple.     Right lower leg: No edema.     Left lower leg: No edema.  Skin:    General: Skin is warm and dry.     Capillary Refill: Capillary refill takes less than 2 seconds.  Neurological:     General: No focal deficit present.     Mental Status: She is alert and oriented to person, place, and time. Mental status is at baseline.   Psychiatric:        Mood and Affect: Mood normal.     Procedures  Procedures  ED Course / MDM   Clinical Course as of 01/08/22 0311  Fri Jan 07, 2022  2019 WBC(!): 15.3 [SB]  2031 Pt sleeping comfortably on stretcher. Pt re-evaluated and discussed with patient treatment plan in the ED. Pt agreeable at this time. [SB]  2100 D-Dimer, Quant(!): 0.70 [SB]  Sat Jan 08, 2022  0153 Reevlauated and HR improved to 105 at time of my arrival to the room. She states she is feeling much better and no longer appears tachypneic when speaking.  [RS]    Clinical Course User Index [RS] Abigail Knight, Jan 10, 2022, PA-C [SB] Blue, Soijett A, PA-C   Medical Decision Making Amount and/or Complexity of Data Reviewed Labs: ordered. Decision-making details documented in ED Course.    Details: CBC with leukocytosis of 15,000, CMP unremarkable, however D-dimer is elevated to 0.7.  Troponin is normal. Radiology: ordered and independent interpretation performed.    Details: Chest x-ray visualized by this provider,  negative for acute cardiopulmonary disease. ECG/medicine tests: independent interpretation performed.    Details: EKG with sinus tachycardia but without interval changes or STEMI.  Risk Prescription drug management.    Care of this patient assumed from preceding ED provider S.  Blue, PA-C at time of shift change.  Please see her associated note for further insight in the patient's ED course.  In brief patient is a 36 year old Arabic speaking female presents with concern for multiple complaints.  Generalized body aches, nausea, vomiting, chest pain, shortness of breath.  Patient was found to be tachycardic and tachypneic in the emergency department.  Unclear if patient has been out of the country recently; temperature change patient is awaiting CT imaging for further characterization of her chest pain, hemodynamic changes, and abdominal pain.  CBC with mild leukocytosis of 15,000, no anemia.  CMP  unremarkable, lipase is normal.  Dimer elevated to 0.7, patient is not pregnant, RVP negative, troponin negative, UA without evidence of infection and wet prep is negative.  Overall patient's Clinical picture of body aches, nausea vomiting chest pain shortness of breath belly pain, fatigue, and body aches most consistent with viral syndrome at this time given extensive work-up with no emergent etiology of her symptoms.  While her CT scan did not reveal any PEs, it must be noted that the quality of the opacification of her pulmonary arteries was poor due to contrast timing on the image.Clinical suspicion for PE is low, in patient who has not been out of the country, no hx of hypercoagulability, malignancy, or travel. No known risk factors, not pregnant.   Patient reevaluated after second fluid bolus and dose of pain medication with improvement in her tachycardia now with heart rate of 105.  She states that she feels much better, no longer short of breath nor is she dyspneic with speech at this time.  Ambulating around the room spontaneously without difficulty and stating she is ready to be discharged home.  While the exact etiology of her symptoms remains unclear does not appear to be any emergent source at this time.  Recommend close outpatient follow-up with a primary care doctor; also recommend OB/GYN follow-up for evaluation of an appropriate orientation of her IUD is identified on CT imaging.  Strict return precautions are given. Abigail Knight and her husband  voiced understanding of her medical evaluation and treatment plan. Each of their questions answered to their expressed satisfaction.  Return precautions were given.  Patient is well-appearing, stable, and was discharged in good condition. This chart was dictated using voice recognition software, Dragon. Despite the best efforts of this provider to proofread and correct errors, errors may still occur which can change documentation meaning.        Sherrilee Gilles 01/08/22 0317    Charlynne Pander, MD 01/10/22 [phone]

## 2022-01-08 LAB — WET PREP, GENITAL
Clue Cells Wet Prep HPF POC: NONE SEEN
Sperm: NONE SEEN
Trich, Wet Prep: NONE SEEN
WBC, Wet Prep HPF POC: <10 (ref <10)
Yeast Wet Prep HPF POC: NONE SEEN

## 2022-01-08 MED ORDER — SODIUM CHLORIDE 0.9 % IV BOLUS
1000.0000 mL | Freq: Once | INTRAVENOUS | Status: AC
  Administered 2022-01-08: 1000 mL via INTRAVENOUS

## 2022-01-08 MED ORDER — FENTANYL CITRATE PF 50 MCG/ML IJ SOSY
50.0000 ug | PREFILLED_SYRINGE | Freq: Once | INTRAMUSCULAR | Status: AC
  Administered 2022-01-08: 50 ug via INTRAVENOUS
  Filled 2022-01-08: qty 1

## 2022-01-08 NOTE — Discharge Instructions (Addendum)
While the exact cause of your symptoms remains unclear, suspect you are experiencing a viral illness causing your symptoms. No dangerous causes were identified on your workup in the ER today. Please follow up with your OBGYN for reevaluation of the location of your IUD and return to the ER with any new severe symptoms.

## 2022-01-10 LAB — GC/CHLAMYDIA PROBE AMP (~~LOC~~) NOT AT ARMC
Chlamydia: NEGATIVE
Comment: NEGATIVE
Comment: NORMAL
Neisseria Gonorrhea: NEGATIVE
